# Patient Record
Sex: Male | Born: 1945 | Race: White | Hispanic: No | Marital: Married | Smoking: Never smoker
Health system: Southern US, Community
[De-identification: ages and names within clinical notes are randomized; demographics above are authoritative.]

## PROBLEM LIST (undated history)

## (undated) DIAGNOSIS — I10 Essential (primary) hypertension: Secondary | ICD-10-CM

## (undated) DIAGNOSIS — R972 Elevated prostate specific antigen [PSA]: Secondary | ICD-10-CM

## (undated) DIAGNOSIS — E785 Hyperlipidemia, unspecified: Secondary | ICD-10-CM

## (undated) DIAGNOSIS — Z87442 Personal history of urinary calculi: Secondary | ICD-10-CM

## (undated) DIAGNOSIS — M19012 Primary osteoarthritis, left shoulder: Secondary | ICD-10-CM

## (undated) DIAGNOSIS — K219 Gastro-esophageal reflux disease without esophagitis: Secondary | ICD-10-CM

## (undated) DIAGNOSIS — E119 Type 2 diabetes mellitus without complications: Secondary | ICD-10-CM

## (undated) DIAGNOSIS — C689 Malignant neoplasm of urinary organ, unspecified: Secondary | ICD-10-CM

## (undated) DIAGNOSIS — R339 Retention of urine, unspecified: Secondary | ICD-10-CM

## (undated) DIAGNOSIS — Z87448 Personal history of other diseases of urinary system: Secondary | ICD-10-CM

## (undated) DIAGNOSIS — R351 Nocturia: Secondary | ICD-10-CM

## (undated) DIAGNOSIS — N400 Enlarged prostate without lower urinary tract symptoms: Secondary | ICD-10-CM

## (undated) DIAGNOSIS — R51 Headache: Secondary | ICD-10-CM

## (undated) DIAGNOSIS — G039 Meningitis, unspecified: Secondary | ICD-10-CM

## (undated) DIAGNOSIS — R300 Dysuria: Secondary | ICD-10-CM

## (undated) DIAGNOSIS — E875 Hyperkalemia: Secondary | ICD-10-CM

## (undated) DIAGNOSIS — R3915 Urgency of urination: Secondary | ICD-10-CM

## (undated) DIAGNOSIS — R350 Frequency of micturition: Secondary | ICD-10-CM

## (undated) DIAGNOSIS — Z8719 Personal history of other diseases of the digestive system: Secondary | ICD-10-CM

## (undated) HISTORY — PX: OTHER SURGICAL HISTORY: SHX169

## (undated) HISTORY — PX: URETEROLITHOTOMY: SHX71

## (undated) HISTORY — DX: Elevated prostate specific antigen (PSA): R97.20

## (undated) HISTORY — DX: Essential (primary) hypertension: I10

## (undated) HISTORY — DX: Hyperlipidemia, unspecified: E78.5

## (undated) HISTORY — DX: Hyperkalemia: E87.5

---
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
## 1995-08-14 HISTORY — PX: ROTATOR CUFF REPAIR: SHX139

## 2003-04-02 ENCOUNTER — Ambulatory Visit (HOSPITAL_COMMUNITY): Admission: RE | Admit: 2003-04-02 | Discharge: 2003-04-02 | Payer: Self-pay | Admitting: Gastroenterology

## 2003-10-08 ENCOUNTER — Ambulatory Visit (HOSPITAL_COMMUNITY): Admission: RE | Admit: 2003-10-08 | Discharge: 2003-10-08 | Payer: Self-pay | Admitting: *Deleted

## 2004-01-21 ENCOUNTER — Ambulatory Visit (HOSPITAL_COMMUNITY): Admission: RE | Admit: 2004-01-21 | Discharge: 2004-01-21 | Payer: Self-pay | Admitting: Unknown Physician Specialty

## 2004-03-09 ENCOUNTER — Ambulatory Visit (HOSPITAL_COMMUNITY): Admission: RE | Admit: 2004-03-09 | Discharge: 2004-03-10 | Payer: Self-pay | Admitting: Neurological Surgery

## 2004-03-09 HISTORY — PX: ANTERIOR CERVICAL DECOMP/DISCECTOMY FUSION: SHX1161

## 2004-03-30 ENCOUNTER — Encounter: Admission: RE | Admit: 2004-03-30 | Discharge: 2004-03-30 | Payer: Self-pay | Admitting: Neurological Surgery

## 2004-04-24 ENCOUNTER — Encounter: Admission: RE | Admit: 2004-04-24 | Discharge: 2004-04-24 | Payer: Self-pay | Admitting: Neurological Surgery

## 2004-06-13 ENCOUNTER — Encounter: Admission: RE | Admit: 2004-06-13 | Discharge: 2004-06-13 | Payer: Self-pay | Admitting: Neurological Surgery

## 2004-08-10 ENCOUNTER — Encounter: Admission: RE | Admit: 2004-08-10 | Discharge: 2004-08-10 | Payer: Self-pay | Admitting: Neurological Surgery

## 2004-08-13 HISTORY — PX: INGUINAL HERNIA REPAIR: SUR1180

## 2004-10-06 ENCOUNTER — Ambulatory Visit: Payer: Self-pay | Admitting: Family Medicine

## 2004-10-20 ENCOUNTER — Ambulatory Visit: Payer: Self-pay | Admitting: Family Medicine

## 2005-01-22 ENCOUNTER — Ambulatory Visit: Payer: Self-pay | Admitting: Family Medicine

## 2005-04-24 ENCOUNTER — Ambulatory Visit: Payer: Self-pay | Admitting: Family Medicine

## 2005-06-12 ENCOUNTER — Ambulatory Visit: Payer: Self-pay | Admitting: Family Medicine

## 2005-07-24 ENCOUNTER — Ambulatory Visit: Payer: Self-pay | Admitting: Family Medicine

## 2006-01-24 ENCOUNTER — Ambulatory Visit: Payer: Self-pay | Admitting: Family Medicine

## 2006-03-14 ENCOUNTER — Ambulatory Visit: Payer: Self-pay | Admitting: Family Medicine

## 2006-09-10 ENCOUNTER — Ambulatory Visit: Payer: Self-pay | Admitting: Family Medicine

## 2006-09-27 ENCOUNTER — Ambulatory Visit: Payer: Self-pay | Admitting: Family Medicine

## 2006-10-16 ENCOUNTER — Ambulatory Visit: Payer: Self-pay | Admitting: Family Medicine

## 2009-04-12 DIAGNOSIS — I10 Essential (primary) hypertension: Secondary | ICD-10-CM | POA: Insufficient documentation

## 2009-04-12 DIAGNOSIS — E785 Hyperlipidemia, unspecified: Secondary | ICD-10-CM

## 2009-04-12 DIAGNOSIS — R972 Elevated prostate specific antigen [PSA]: Secondary | ICD-10-CM

## 2009-04-12 DIAGNOSIS — R42 Dizziness and giddiness: Secondary | ICD-10-CM | POA: Insufficient documentation

## 2009-04-13 ENCOUNTER — Ambulatory Visit: Payer: Self-pay | Admitting: Cardiology

## 2009-04-21 ENCOUNTER — Telehealth: Payer: Self-pay | Admitting: Cardiology

## 2009-04-22 ENCOUNTER — Ambulatory Visit: Payer: Self-pay | Admitting: Cardiology

## 2009-04-22 ENCOUNTER — Encounter: Payer: Self-pay | Admitting: Cardiology

## 2009-05-10 ENCOUNTER — Encounter: Payer: Self-pay | Admitting: Cardiology

## 2009-05-11 ENCOUNTER — Ambulatory Visit: Payer: Self-pay | Admitting: Cardiology

## 2009-05-11 DIAGNOSIS — R5381 Other malaise: Secondary | ICD-10-CM

## 2009-05-11 DIAGNOSIS — R5383 Other fatigue: Secondary | ICD-10-CM

## 2009-05-11 DIAGNOSIS — R079 Chest pain, unspecified: Secondary | ICD-10-CM | POA: Insufficient documentation

## 2010-12-26 NOTE — Assessment & Plan Note (Signed)
[facility]                            CARDIOLOGY OFFICE NOTE   Frank Delacruz, Frank Delacruz                       MRN:          [id number]  DATE:04/13/2009                            DOB:          05-28-1946    PRIMARY PHYSICIAN:  Delaney Meigs, MD   REASON FOR PRESENTATION:  Evaluate the patient with chest pain.   HISTORY OF PRESENT ILLNESS:  The patient is a pleasant 65 year old  gentleman.  He was seen at [facility] in 2005 for chest pain.  He had a  stress perfusion study at that time that demonstrated no evidence of  ischemia or infarct.  He has not had any cardiac evaluation since that  time.  However, he did present to [facility] on August 16 with chest  discomfort.  I do not have any of these records.  He apparently was kept  overnight.  It was suggested that he have a stress test, but he wanted  to leave.  He describes some chest discomfort that was right of his  sternum.  It felt like pins and needles, but he said it was severe.  It  woken from his sleep.  Waxed and waned apparently.  There was some  nausea associated.  There was no radiation to neck into his arms.  He  did not have this before.  He was not short of breath.  He was  apparently clammy and somewhat diaphoretic.  He said that since that  time, he has had no recurrence of this discomfort.  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  With his activities of daily living, he does not bring on any of these  symptoms.  He denies any resting complaints such as PND or orthopnea.  He is not reporting palpitations, presyncope, or syncope.  Of note, the  patient did have a slow heart rhythm apparently when he was in the  hospital.  He recently had his dose of beta-blocker reduced by Dr.  Lysbeth Galas.   PAST MEDICAL HISTORY:  Hypertension x6 years, hypertriglyceridemia,  benign prostatic hypertrophy.   PAST SURGICAL HISTORY:  Rotator cuff  surgery, hernia repair of right  inguinal, neck fusion.   ALLERGIES:  Intolerances to NAPROXEN, DICLOFENAC, MELOXICAM.   MEDICATIONS:  1. Avodart 0.5 mg daily.  2. Atenolol 50 mg daily.  3. Hydrocodone/APAP.  4. Aspirin 81 mg daily.  5. Fish oil daily.  This couple about the   SOCIAL HISTORY:  The patient is retired.  He is married.  He has no  children.  He does not smoke cigarettes and never has though he does  chew tobacco occasionally when he is bored.   FAMILY HISTORY:  Contributory for brother dying at age 59 apparently of  heart disease.  His sister died recently suddenly of a myocardial  infarction at 71, though she was apparently ill prior to this.   REVIEW OF SYSTEMS:  As stated in the HPI and positive for reflux,  urinary frequency, joint pains.  Negative for all other systems.  PHYSICAL EXAMINATION:  GENERAL:  The patient is pleasant and in no  distress.  VITAL SIGNS:  Blood pressure 130/86, heart rate 56 and regular, weight  191 pounds, body mass index 30.  HEENT:  Eyelids unremarkable, pupils equal, round, and reactive to  light, fundi not visualized, oral mucosa unremarkable.  NECK:  No jugular venous distention at 45 degrees.  Carotid upstroke  brisk and symmetrical.  No bruits, no thyromegaly.  LYMPHATICS:  No  cervical, axillary, or inguinal adenopathy.  LUNGS:  Clear to auscultation bilaterally.  BACK:  No costovertebral angle tenderness.  CHEST:  Unremarkable.  HEART:  PMI not displaced or sustained, S1 and S2 within normal limits,  no S3, no S4, no clicks, no rubs, no murmurs.  ABDOMEN:  Flat, positive  bowel sounds, normal in frequency and pitch.  No bruits, no rebound, no  guarding, no midline pulsatile mass, no hepatomegaly, no splenomegaly.  SKIN:  No rashes, no nodules.  EXTREMITIES:  2+ pulses throughout, no edema, no cyanosis, or clubbing.  NEURO:  Oriented to birth, place, and time.  Cranial nerves II through  XII grossly intact, motor grossly  intact.   EKG sinus rhythm, rate 56, axis within normal limits, intervals within  normal limits, early transition lead V2, no acute ST-T wave changes.   ASSESSMENT AND PLAN:  1. Chest:  The patient's chest comfort has some typical and some      atypical features.  He does have a family history.  I think the      pretest probability of obstructive coronary disease is low.      However, given the family history in particular and his history of      hypertension, I think screening with an exercise treadmill test is      indicated.  This would be a plain old exercise treadmill (POET).      He wants to have this done in [place], and I will arrange this.  2. Tobacco.  He understands need to stop all tobacco products.  3. Hypertension.  His blood pressure is controlled.  I would agree      with decreasing the beta-blocker given his recent bradycardia.  4. Risk reduction.  He reports his lipids have been followed by Dr.      Lysbeth Galas, and that he has had a high triglyceride, but no other      significant abnormalities.  I would suggest that given the family      history of goal LDL less than 100, HDL greater than 40, it would be      aggressive, but reasonable.  5. Followup.  I will see him back as needed based on the results of      the pulse oximeter/end tidal.    Rollene Rotunda, MD, [facility]  Electronically Signed   JH/MedQ  DD: 04/13/2009  DT: 04/13/2009  Job #: [id number]   cc:   Delaney Meigs, M.D.

## 2010-12-29 NOTE — Op Note (Signed)
NAME:  Frank Delacruz, Frank Delacruz                          ACCOUNT NO.:  [id number]   MEDICAL RECORD NO.:  [id number]                   PATIENT TYPE:  OIB   LOCATION:  2899                                 FACILITY:  [facility]   PHYSICIAN:  Tia Alert, MD                  DATE OF BIRTH:  September 02, 1945   DATE OF PROCEDURE:  03/09/2004  DATE OF DISCHARGE:                                 OPERATIVE REPORT   PREOPERATIVE DIAGNOSIS:  Cervical spondylosis with degenerative disk  disease, C5-6, with left C6 radiculopathy.   POSTOPERATIVE DIAGNOSIS:  Cervical spondylosis with degenerative disk  disease, C5-6, with left C6 radiculopathy.   PROCEDURES:  1. Decompressive anterior cervical diskectomy, C5-6, for nerve root     decompression.  2. Anterior cervical arthrodesis, C5-6, utilizing an 8 mm Tutogen allograft.  3. Anterior cervical plating, C5-6, with a 25 mm Zephir plate.   SURGEON:  Tia Alert, M.D.   ASSISTANT:  Reinaldo Meeker, M.D.   ANESTHESIA:  General endotracheal.   COMPLICATIONS:  None apparent.   INDICATION FOR PROCEDURE:  Mr. Wilkerson is a 65 year old white male who was  referred to the neurosurgery clinic with complaints of neck pain with left  arm pain in a C6 distribution.  He had an MRI and plain films, which showed  degenerative disk disease with spondylosis at C5-6 with neural foraminal  narrowing at C5-6 on the left side.  I recommended anterior cervical  diskectomy, fusion, and plating at C5-6.  We discussed the risks, benefits  and expected outcome.  He wished to proceed.   DESCRIPTION OF PROCEDURE:  The patient was taken to the operating room and  after induction of adequate generalized endotracheal anesthesia, he was  placed in the supine position on the operating room table.  His right  anterior cervical region was prepped with Duraprep and then draped in the  usual sterile fashion.  Local anesthesia 5 mL was injected and an incision  was made to the right of midline  and then carried down to the platysma  muscle, which was elevated, opened, and undermined with Metzenbaum scissors.  I then dissected in a plane medial to the sternocleidomastoid muscle,  internal carotid artery, and lateral to the trachea and esophagus to expose  the anterior cervical spine at C5-6.  Intraoperative fluoroscopy confirmed  our level at C5-6, and then the anterior osteophytes were removed with a  Leksell rongeur.  The longus colli muscles were taken down bilaterally and  the Shadow Line retractors were placed under these to expose the anterior  cervical spine at C5-6.  The annulus was incised.  The anterior osteophytes  were removed with a Kerrison punch and then the initial diskectomy was done  with pituitary rongeurs and the end plates were drilled with the high-speed  Black Max air-powered drill to prepare the end plates for arthrodesis.  We  drilled down  to the level of the posterior longitudinal ligament and brought  in the operating microscope.  This was then opened and it was removed in a  circumferential fashion to expose the underlying dura.  The nerve roots were  identified bilaterally and the C6 nerve roots were followed out into their  foramina.  He had a small diverticulum on the left C6 nerve root, probably  of clinical insignificance, but both nerve roots were followed out to the  foramen.  They were completely decompressed.  We then irrigated with saline  solution and dried the surgical bed with Surgifoam.  We then measured the  disk space to be 8 mm and tapped the 8 mm Tutogen allograft into the  interspace.  We checked this under fluoroscopy.  We then used the 25 mm  Zephir plate and placed two 13 mm screws in the body of C5, two in the body  of C6, and locked it into position with a sliding mechanism and then once  again checked this under fluoroscopy.  The retractor system was removed and  the bleeding points were identified and coagulated with bipolar  cautery.  Once meticulous hemostasis was achieved, we closed the platysma and then the  subcuticular tissues with 3-0 Vicryl.  We then closed the skin with  Dermabond.  The drapes were removed.  A sterile dressing was applied. The  patient was awakened from general anesthesia and transferred to the recovery  room in stable condition.  At the end of the procedure all sponge, needle,  and instrument counts were correct.                                               Tia Alert, MD    DSJ/MEDQ  D:  03/09/2004  T:  03/09/2004  Job:  [id number]

## 2011-01-25 DIAGNOSIS — R072 Precordial pain: Secondary | ICD-10-CM

## 2011-03-20 ENCOUNTER — Encounter: Payer: Self-pay | Admitting: Cardiology

## 2011-10-23 ENCOUNTER — Ambulatory Visit (INDEPENDENT_AMBULATORY_CARE_PROVIDER_SITE_OTHER): Payer: Medicare Other | Admitting: Urology

## 2011-10-23 DIAGNOSIS — N400 Enlarged prostate without lower urinary tract symptoms: Secondary | ICD-10-CM

## 2011-10-23 DIAGNOSIS — C679 Malignant neoplasm of bladder, unspecified: Secondary | ICD-10-CM

## 2011-10-30 ENCOUNTER — Other Ambulatory Visit: Payer: Self-pay | Admitting: Urology

## 2011-11-13 ENCOUNTER — Encounter (HOSPITAL_BASED_OUTPATIENT_CLINIC_OR_DEPARTMENT_OTHER): Payer: Self-pay | Admitting: *Deleted

## 2011-11-16 ENCOUNTER — Encounter (HOSPITAL_BASED_OUTPATIENT_CLINIC_OR_DEPARTMENT_OTHER): Payer: Self-pay | Admitting: *Deleted

## 2011-11-16 NOTE — Progress Notes (Signed)
NPO AFTER MN. ARRIVES AT 0715. NEEDS ISTAT AND EKG. WILL TAKE ATENOLOL AND ZANTAC AM OF SURG. W/ SIP OF WATER.

## 2011-11-21 ENCOUNTER — Encounter (HOSPITAL_BASED_OUTPATIENT_CLINIC_OR_DEPARTMENT_OTHER): Payer: Self-pay | Admitting: Anesthesiology

## 2011-11-21 ENCOUNTER — Encounter (HOSPITAL_BASED_OUTPATIENT_CLINIC_OR_DEPARTMENT_OTHER): Payer: Self-pay | Admitting: *Deleted

## 2011-11-21 ENCOUNTER — Encounter (HOSPITAL_BASED_OUTPATIENT_CLINIC_OR_DEPARTMENT_OTHER)
Admission: RE | Disposition: A | Discharge status: Home / Self Care | Payer: Self-pay | Source: Ambulatory Visit | Attending: Urology

## 2011-11-21 ENCOUNTER — Ambulatory Visit (HOSPITAL_BASED_OUTPATIENT_CLINIC_OR_DEPARTMENT_OTHER)
Admission: RE | Admit: 2011-11-21 | Discharge: 2011-11-21 | Disposition: A | Discharge status: Home / Self Care | Payer: Medicare Other | Source: Ambulatory Visit | Attending: Urology | Admitting: Urology

## 2011-11-21 ENCOUNTER — Ambulatory Visit (HOSPITAL_BASED_OUTPATIENT_CLINIC_OR_DEPARTMENT_OTHER): Payer: Medicare Other | Admitting: Anesthesiology

## 2011-11-21 DIAGNOSIS — N3090 Cystitis, unspecified without hematuria: Secondary | ICD-10-CM | POA: Insufficient documentation

## 2011-11-21 DIAGNOSIS — K219 Gastro-esophageal reflux disease without esophagitis: Secondary | ICD-10-CM | POA: Insufficient documentation

## 2011-11-21 DIAGNOSIS — C669 Malignant neoplasm of unspecified ureter: Secondary | ICD-10-CM | POA: Insufficient documentation

## 2011-11-21 DIAGNOSIS — I10 Essential (primary) hypertension: Secondary | ICD-10-CM | POA: Insufficient documentation

## 2011-11-21 DIAGNOSIS — N35919 Unspecified urethral stricture, male, unspecified site: Secondary | ICD-10-CM | POA: Insufficient documentation

## 2011-11-21 DIAGNOSIS — E785 Hyperlipidemia, unspecified: Secondary | ICD-10-CM | POA: Insufficient documentation

## 2011-11-21 DIAGNOSIS — Z8551 Personal history of malignant neoplasm of bladder: Secondary | ICD-10-CM | POA: Insufficient documentation

## 2011-11-21 DIAGNOSIS — N329 Bladder disorder, unspecified: Secondary | ICD-10-CM | POA: Insufficient documentation

## 2011-11-21 DIAGNOSIS — D494 Neoplasm of unspecified behavior of bladder: Secondary | ICD-10-CM | POA: Insufficient documentation

## 2011-11-21 HISTORY — DX: Personal history of urinary calculi: Z87.442

## 2011-11-21 HISTORY — DX: Nocturia: R35.1

## 2011-11-21 HISTORY — DX: Malignant neoplasm of urinary organ, unspecified: C68.9

## 2011-11-21 HISTORY — DX: Personal history of other diseases of the digestive system: Z87.19

## 2011-11-21 HISTORY — DX: Urgency of urination: R39.15

## 2011-11-21 HISTORY — PX: CYSTOSCOPY W/ RETROGRADES: SHX1426

## 2011-11-21 HISTORY — DX: Gastro-esophageal reflux disease without esophagitis: K21.9

## 2011-11-21 HISTORY — DX: Primary osteoarthritis, left shoulder: M19.012

## 2011-11-21 HISTORY — DX: Frequency of micturition: R35.0

## 2011-11-21 SURGERY — CYSTOSCOPY, WITH RETROGRADE PYELOGRAM
Anesthesia: General | Site: Bladder | Laterality: Bilateral | Wound class: Clean Contaminated

## 2011-11-21 MED ORDER — LACTATED RINGERS IV SOLN: INTRAVENOUS | Status: DC

## 2011-11-21 MED ORDER — LIDOCAINE HCL (CARDIAC) 20 MG/ML IV SOLN
INTRAVENOUS | Status: DC | PRN
  Administered 2011-11-21: 80 mg via INTRAVENOUS

## 2011-11-21 MED ORDER — SULFAMETHOXAZOLE-TRIMETHOPRIM 800-160 MG PO TABS: 1.0000 | ORAL_TABLET | Freq: Two times a day (BID) | ORAL | Status: AC

## 2011-11-21 MED ORDER — GLYCOPYRROLATE 0.2 MG/ML IJ SOLN
INTRAMUSCULAR | Status: DC | PRN
  Administered 2011-11-21: 0.4 mg via INTRAVENOUS

## 2011-11-21 MED ORDER — OXYCODONE HCL 5 MG PO TABS: 5.0000 mg | ORAL_TABLET | ORAL | Status: DC | PRN

## 2011-11-21 MED ORDER — FENTANYL CITRATE 0.05 MG/ML IJ SOLN
25.0000 ug | INTRAMUSCULAR | Status: DC | PRN
  Administered 2011-11-21 (×2): 25 ug via INTRAVENOUS

## 2011-11-21 MED ORDER — ONDANSETRON HCL 4 MG/2ML IJ SOLN
INTRAMUSCULAR | Status: DC | PRN
  Administered 2011-11-21: 4 mg via INTRAVENOUS

## 2011-11-21 MED ORDER — IOHEXOL 350 MG/ML SOLN
INTRAVENOUS | Status: DC | PRN
  Administered 2011-11-21: 10 mL via INTRAVENOUS

## 2011-11-21 MED ORDER — OXYCODONE-ACETAMINOPHEN 5-325 MG PO TABS
1.0000 | ORAL_TABLET | ORAL | Status: DC | PRN
  Administered 2011-11-21: 1 via ORAL

## 2011-11-21 MED ORDER — SODIUM CHLORIDE 0.9 % IJ SOLN: 3.0000 mL | INTRAMUSCULAR | Status: DC | PRN

## 2011-11-21 MED ORDER — SODIUM CHLORIDE 0.9 % IV SOLN: 250.0000 mL | INTRAVENOUS | Status: DC | PRN

## 2011-11-21 MED ORDER — MORPHINE SULFATE 2 MG/ML IJ SOLN: 2.0000 mg | INTRAMUSCULAR | Status: DC | PRN

## 2011-11-21 MED ORDER — CIPROFLOXACIN IN D5W 400 MG/200ML IV SOLN
400.0000 mg | INTRAVENOUS | Status: AC
  Administered 2011-11-21: 400 mg via INTRAVENOUS

## 2011-11-21 MED ORDER — URIBEL 118 MG PO CAPS: 1.0000 | ORAL_CAPSULE | Freq: Three times a day (TID) | ORAL | Status: DC | PRN

## 2011-11-21 MED ORDER — ACETAMINOPHEN 650 MG RE SUPP: 650.0000 mg | RECTAL | Status: DC | PRN

## 2011-11-21 MED ORDER — URELLE 81 MG PO TABS
1.0000 | ORAL_TABLET | Freq: Four times a day (QID) | ORAL | Status: DC
  Administered 2011-11-21: 81 mg via ORAL

## 2011-11-21 MED ORDER — SODIUM CHLORIDE 0.9 % IJ SOLN: 3.0000 mL | Freq: Two times a day (BID) | INTRAMUSCULAR | Status: DC

## 2011-11-21 MED ORDER — PROPOFOL 10 MG/ML IV EMUL
INTRAVENOUS | Status: DC | PRN
  Administered 2011-11-21: 200 mg via INTRAVENOUS
  Administered 2011-11-21: 20 mg via INTRAVENOUS

## 2011-11-21 MED ORDER — LACTATED RINGERS IV SOLN
INTRAVENOUS | Status: DC
  Administered 2011-11-21 (×3): via INTRAVENOUS

## 2011-11-21 MED ORDER — ACETAMINOPHEN 325 MG PO TABS: 650.0000 mg | ORAL_TABLET | ORAL | Status: DC | PRN

## 2011-11-21 MED ORDER — PROMETHAZINE HCL 25 MG/ML IJ SOLN
6.2500 mg | INTRAMUSCULAR | Status: DC | PRN
  Administered 2011-11-21: 6.25 mg via INTRAVENOUS

## 2011-11-21 MED ORDER — FENTANYL CITRATE 0.05 MG/ML IJ SOLN
INTRAMUSCULAR | Status: DC | PRN
  Administered 2011-11-21: 25 ug via INTRAVENOUS
  Administered 2011-11-21: 50 ug via INTRAVENOUS
  Administered 2011-11-21: 25 ug via INTRAVENOUS

## 2011-11-21 MED ORDER — ONDANSETRON HCL 4 MG/2ML IJ SOLN: 4.0000 mg | Freq: Four times a day (QID) | INTRAMUSCULAR | Status: DC | PRN

## 2011-11-21 MED ORDER — STERILE WATER FOR IRRIGATION IR SOLN
Status: DC | PRN
  Administered 2011-11-21: 3000 mL

## 2011-11-21 MED ORDER — EPHEDRINE SULFATE 50 MG/ML IJ SOLN
INTRAMUSCULAR | Status: DC | PRN
  Administered 2011-11-21 (×2): 15 mg via INTRAVENOUS

## 2011-11-21 MED ORDER — OXYCODONE-ACETAMINOPHEN 5-325 MG PO TABS: 1.0000 | ORAL_TABLET | ORAL | Status: AC | PRN

## 2011-11-21 SURGICAL SUPPLY — 23 items
ADAPTER CATH URET PLST 4-6FR (CATHETERS) ×1 IMPLANT
ADPR CATH URET STRL DISP 4-6FR (CATHETERS) ×1
BAG DRAIN URO-CYSTO SKYTR STRL (DRAIN) ×2 IMPLANT
BAG DRN UROCATH (DRAIN) ×1
BASKET ZERO TIP NITINOL 2.4FR (BASKET) ×1 IMPLANT
BSKT STON RTRVL ZERO TP 2.4FR (BASKET) ×1
CANISTER SUCT LVC 12 LTR MEDI- (MISCELLANEOUS) ×2 IMPLANT
CATH INTERMIT  6FR 70CM (CATHETERS) ×1 IMPLANT
CATH URET 5FR 28IN CONE TIP (BALLOONS)
CATH URET 5FR 28IN OPEN ENDED (CATHETERS) IMPLANT
CATH URET 5FR 70CM CONE TIP (BALLOONS) IMPLANT
CLOTH BEACON ORANGE TIMEOUT ST (SAFETY) ×2 IMPLANT
DRAPE CAMERA CLOSED 9X96 (DRAPES) ×2 IMPLANT
GLOVE BIO SURGEON STRL SZ8 (GLOVE) ×2 IMPLANT
GLOVE INDICATOR 6.5 STRL GRN (GLOVE) ×2 IMPLANT
GOWN PREVENTION PLUS LG XLONG (DISPOSABLE) ×2 IMPLANT
GOWN STRL REIN XL XLG (GOWN DISPOSABLE) ×2 IMPLANT
GUIDEWIRE 0.038 PTFE COATED (WIRE) IMPLANT
GUIDEWIRE ANG ZIPWIRE 038X150 (WIRE) IMPLANT
GUIDEWIRE STR DUAL SENSOR (WIRE) ×1 IMPLANT
NS IRRIG 500ML POUR BTL (IV SOLUTION) IMPLANT
PACK CYSTOSCOPY (CUSTOM PROCEDURE TRAY) ×2 IMPLANT
WATER STERILE IRR 3000ML UROMA (IV SOLUTION) ×1 IMPLANT

## 2011-11-21 NOTE — Discharge Instructions (Addendum)

## 2011-11-21 NOTE — Anesthesia Postprocedure Evaluation (Signed)
Anesthesia Post Note  Patient: Frank Delacruz  Procedure(s) Performed: Procedure(s) (LRB): CYSTOSCOPY WITH RETROGRADE PYELOGRAM (Bilateral)  Anesthesia type: General  Patient location: PACU  Post pain: Pain level controlled  Post assessment: Post-op Vital signs reviewed  Last Vitals:  Filed Vitals:   11/21/11 1130  BP: 164/99  Pulse: 76  Temp: 36.1 C  Resp: 16    Post vital signs: Reviewed  Level of consciousness: sedated  Complications: No apparent anesthesia complications

## 2011-11-21 NOTE — H&P (Signed)
Urology History and Physical Exam  CC: History of bladder cancer  HPI: 66 year old male with the following history:    His history started in January, 2012. At that time, he presented with a right-sided distal ureteral stone which needed ureteroscopy and extraction. At the time of that anesthetic procedure, he was found to have a left-sided bladder tumor. This was resected by Dr. Baldo Ash in [place], [place]. Pathology revealed high-grade papillary urothelial carcinoma without evidence of lamina propria or muscular invasion. Because of the high-grade nature of his disease, he underwent induction BCG therapy. Following this, he underwent repeat anesthetic cystoscopy and TURBT of a recurrent tumor on 02/08/2011. He underwent a second induction BCG series, which was completed by September, 2012. Followup cystoscopy in September and December 2012 were clear.  Dr. Baldo Ash left the region, and the patient at this point came back for further surveillance. He has some dysuria, and significant lower urinary tract symptoms including frequency, urgency, occasional dysuria, nocturia, hesitancy and intermittency. However, he has not had recurrent gross hematuria.  Surveillance cystoscopy at our office in [place] on 10/23/2011 revealed erythema at the old bladder tumor site, and cytologies were positive. He presents at this time for cystoscopy, bladder biopsy and fulguration and retrograde pyelograms  He is on both finasteride and tamsulosin for his BPH symptoms.    PMH: Past Medical History  Diagnosis Date  . Hyperlipidemia, mild   . Increased prostate specific antigen (PSA) velocity   . Hypertension   . Urothelial carcinoma of bladder  . H/O hiatal hernia   . GERD (gastroesophageal reflux disease) occasionlly takes zantac  . Arthritis of shoulder region, left   . History of kidney stones   . Frequency of urination   . Urgency of urination   . Nocturia   . Hematuria     PSH: Past Surgical  History  Procedure Date  . Anterior cervical decomp/discectomy fusion 03-09-2004    C5 - 6  . Echo/ cardiology stress test 04-22-2009  [facility]    NORMAL  . Ureterolithotomy MARCH 2012-- [facility]  . Cysto/  resection bladder lesion with bx JULY 2012-- [facility]  . Rotator cuff repair 1997    LEFT  . Inguinal hernia repair 2006    RIGHT    Allergies: Allergies  Allergen Reactions  . Meloxicam Nausea And Vomiting  . Naproxen Nausea And Vomiting    Medications: No prescriptions prior to admission     Social History: History   Social History  . Marital Status: Married    Spouse Name: N/A    Number of Children: N/A  . Years of Education: N/A   Occupational History  . Not on file.   Social History Main Topics  . Smoking status: Former Smoker    Types: Cigarettes  . Smokeless tobacco: Current User    Types: Chew   Comment: CHEWED TOBACCO FOR APPROX. 50 YRS - STATES HAS CUT-DOWN SINCE MARCH 2012  . Alcohol Use: No  . Drug Use: No  . Sexually Active: Not on file   Other Topics Concern  . Not on file   Social History Narrative  . No narrative on file    Family History: Family History  Problem Relation Age of Onset  . Stroke Mother   . Cancer Father     Review of Systems: Genitourinary, constitutional, skin, eye, otolaryngeal, hematologic/lymphatic, cardiovascular, pulmonary, endocrine, musculoskeletal, gastrointestinal, neurological and psychiatric system(s) were reviewed and pertinent findings if present are noted.  Genitourinary: urinary frequency,  feelings of urinary urgency, dysuria, nocturia, incontinence, weak urinary stream, urinary stream starts and stops, erectile dysfunction and initiating urination requires straining.  Gastrointestinal: abdominal pain and constipation.  Constitutional: feeling tired (fatigue).  Musculoskeletal: back pain and joint pain.  Neurological: headache.     Physical Exam: @VITALS2 @ Constitutional: Well nourished and well  developed . No acute distress.  ENT:. The ears and nose are normal in appearance.  Neck: The appearance of the neck is normal and no neck mass is present.  Pulmonary: No respiratory distress and normal respiratory rhythm and effort.  Cardiovascular: Heart rate and rhythm are normal . No peripheral edema.  Abdomen: The abdomen is obese. The abdomen is soft and nontender. No masses are palpated. No CVA tenderness. No hernias are palpable. No hepatosplenomegaly noted.  Lymphatics: The anterior cervical, supraclavicular and axillary nodes are not enlarged or tender.  Skin: Normal skin turgor, no visible rash and no visible skin lesions.  Neuro/Psych:. Mood and affect are appropriate.       Studies:  No results found for this basename: HGB:2,WBC:2,PLT:2 in the last 72 hours  No results found for this basename: NA:2,K:2,CL:2,CO2:2,BUN:2,CREATININE:2,CALCIUM:2,MAGNESIUM:2,GFRNONAA:2,GFRAA:2 in the last 72 hours   No results found for this basename: PT:2,INR:2,APTT:2 in the last 72 hours   No components found with this basename: ABG:2    Assessment:  Probable recurrent urothelial carcinoma of the bladder.  Plan: Anesthetic cystoscopy, bilateral retrograde pyelograms, bladder biopsy/possible TURBT

## 2011-11-21 NOTE — Anesthesia Procedure Notes (Signed)
Procedure Name: LMA Insertion Date/Time: 11/21/2011 8:47 AM Performed by: Fran Lowes Pre-anesthesia Checklist: Patient identified, Emergency Drugs available, Suction available and Patient being monitored Patient Re-evaluated:Patient Re-evaluated prior to inductionOxygen Delivery Method: Circle System Utilized Preoxygenation: Pre-oxygenation with 100% oxygen Intubation Type: IV induction Ventilation: Mask ventilation without difficulty LMA: LMA with gastric port inserted LMA Size: 5.0 Number of attempts: 1 Placement Confirmation: positive ETCO2 Tube secured with: Tape Dental Injury: Teeth and Oropharynx as per pre-operative assessment

## 2011-11-21 NOTE — Transfer of Care (Signed)
Immediate Anesthesia Transfer of Care Note  Patient: Frank Delacruz  Procedure(s) Performed: Procedure(s) (LRB): CYSTOSCOPY WITH RETROGRADE PYELOGRAM (Bilateral)  Patient Location: Patient transported to PACU with oxygen via face mask at 4 Liters / Min  Anesthesia Type: General  Level of Consciousness: awake and alert   Airway & Oxygen Therapy: Patient Spontanous Breathing and Patient connected to face mask oxygen  Post-op Assessment: Report given to PACU RN and Post -op Vital signs reviewed and stable  Post vital signs: Reviewed and stable  Dentition: Teeth and oropharynx remain in pre-op condition  Complications: No apparent anesthesia complications

## 2011-11-21 NOTE — Anesthesia Preprocedure Evaluation (Signed)
Anesthesia Evaluation  Patient identified by MRN, date of birth, ID band Patient awake    Reviewed: Allergy & Precautions, H&P , NPO status , Patient's Chart, lab work & pertinent test results, reviewed documented beta blocker date and time   History of Anesthesia Complications Negative for: history of anesthetic complications  Airway Mallampati: III TM Distance: >3 FB Neck ROM: Full    Dental  (+) Edentulous Upper   Pulmonary neg pulmonary ROS,  breath sounds clear to auscultation        Cardiovascular hypertension, Pt. on medications and Pt. on home beta blockers negative cardio ROS  Rhythm:Regular     Neuro/Psych negative neurological ROS  negative psych ROS   GI/Hepatic negative GI ROS, Neg liver ROS, hiatal hernia, GERD-  Medicated,  Endo/Other  negative endocrine ROSMorbid obesity  Renal/GU negative Renal ROS  negative genitourinary   Musculoskeletal negative musculoskeletal ROS (+)   Abdominal   Peds  Hematology negative hematology ROS (+)   Anesthesia Other Findings   Reproductive/Obstetrics negative OB ROS                           Anesthesia Physical Anesthesia Plan  ASA: II  Anesthesia Plan: General   Post-op Pain Management:    Induction: Intravenous  Airway Management Planned: LMA  Additional Equipment:   Intra-op Plan:   Post-operative Plan:   Informed Consent: I have reviewed the patients History and Physical, chart, labs and discussed the procedure including the risks, benefits and alternatives for the proposed anesthesia with the patient or authorized representative who has indicated his/her understanding and acceptance.   Dental advisory given  Plan Discussed with: CRNA  Anesthesia Plan Comments:         Anesthesia Quick Evaluation

## 2011-11-21 NOTE — Op Note (Signed)
Preoperative diagnosis: History of urothelial carcinoma the bladder, status post prior resection with positive cytologies  Postoperative diagnosis: History of urothelial carcinoma the bladder, status post prior resection with positive cytologies, with mild bladder irritation and obvious papillary tumor and right ureter  Principal procedure: Cystoscopy, bilateral retrograde ureteropyelograms, right ureteroscopy with biopsy of distal ureteral tumor, bladder biopsies, interpretive fluoroscopy  Surgeon: Ltanya Bayley  Anesthesia: Gen. with LMA  Drains: None  Indications: History of urothelial carcinoma with prior resection by a urologist and he didn't. He was recently seen following BCG, and found to have positive cytologies and 2 bladder lesions. He presents this time for cystoscopy, bladder biopsy and retrograde ureteropyelograms.  Description of procedure: The patient received preoperative IV antibiotics and was taken to the operating room following adequate identification, where he received general anesthesia with LMA. He was placed in the dorsolithotomy position. Genitalia and perineum were prepped and draped. Time out was performed.  A 22 French panendoscope was advanced under direct vision through his urethra. There was a mild bulbous stricture which was easily traversed with the beak of the scope. His bladder was entered and inspected circumferentially. There were no discrete bladder tumors, but 2 areas of patchy erythema were noted, one area in the midline just superior to the trigone approximately 8 mm in size. There was another area lateral and superior to the left ureteral orifice which was approximately 1 cm in size. There was an obvious scar from prior resection at the level of the ureteral orifice on the left, with a golf-hole orifice noted. Bilateral retrograde ureteropyelograms were performed as follows:  A 6 French open-end catheter was advanced into the right ureteral orifice, with  retrograde revealing a 2-3 cm area of filling defect in the distal ureter, approximately 2-3 cm from the UVJ. There was no significant hydronephrosis or filling defects above this. The pyelocalyceal system was normal on the right. On the left, the entire ureter was normal, with a normal pyelocalyceal system. No filling defects were seen on either side.  A guidewire was placed into the right ureter, and ureteroscopy was performed with the rigid scope. This revealed a 3-3-1/2 cm length of ureter involved with papillary lesion. The scope was advanced past this and no significant lesions were seen. I used the stone basket to try to get biopsies from this. This was unsuccessful, and eventually is a small ureteral biopsy forceps which gave me some small samples that we sent to pathology labeled ureteral tumor. There was minimal bleeding. At this point, attention was then turned to the bladder, the 2 prior erythematous areas were biopsied, and sent as posterior bladder biopsy and left bladder biopsy. Both of these areas were cauterized. There was adequate hemostasis, and after the entire bladder was evaluated again and found to be normal, except for 2-3+ trabeculations, the scope was removed after the bladder was drained. A B&O suppository was placed. The patient was awakened and taken to PACU in stable condition.

## 2011-11-22 ENCOUNTER — Encounter (HOSPITAL_BASED_OUTPATIENT_CLINIC_OR_DEPARTMENT_OTHER): Payer: Self-pay | Admitting: Urology

## 2011-11-27 ENCOUNTER — Other Ambulatory Visit: Payer: Self-pay | Admitting: Urology

## 2011-11-27 DIAGNOSIS — D4100 Neoplasm of uncertain behavior of unspecified kidney: Secondary | ICD-10-CM

## 2011-11-30 ENCOUNTER — Ambulatory Visit (HOSPITAL_COMMUNITY)
Admission: RE | Admit: 2011-11-30 | Discharge: 2011-11-30 | Disposition: A | Discharge status: Home / Self Care | Payer: Medicare Other | Source: Ambulatory Visit | Attending: Urology | Admitting: Urology

## 2011-11-30 DIAGNOSIS — K7689 Other specified diseases of liver: Secondary | ICD-10-CM | POA: Insufficient documentation

## 2011-11-30 DIAGNOSIS — D4100 Neoplasm of uncertain behavior of unspecified kidney: Secondary | ICD-10-CM | POA: Insufficient documentation

## 2011-11-30 DIAGNOSIS — R9389 Abnormal findings on diagnostic imaging of other specified body structures: Secondary | ICD-10-CM | POA: Insufficient documentation

## 2011-11-30 MED ORDER — IOHEXOL 300 MG/ML  SOLN
125.0000 mL | Freq: Once | INTRAMUSCULAR | Status: AC | PRN
  Administered 2011-11-30: 125 mL via INTRAVENOUS

## 2011-12-25 ENCOUNTER — Ambulatory Visit (INDEPENDENT_AMBULATORY_CARE_PROVIDER_SITE_OTHER): Payer: Medicare Other | Admitting: Urology

## 2011-12-25 DIAGNOSIS — D4100 Neoplasm of uncertain behavior of unspecified kidney: Secondary | ICD-10-CM

## 2011-12-27 ENCOUNTER — Other Ambulatory Visit: Payer: Self-pay | Admitting: Urology

## 2012-01-04 ENCOUNTER — Encounter (HOSPITAL_COMMUNITY): Payer: Self-pay | Admitting: Pharmacy Technician

## 2012-01-09 ENCOUNTER — Encounter (HOSPITAL_COMMUNITY)
Admission: RE | Admit: 2012-01-09 | Discharge: 2012-01-09 | Disposition: A | Discharge status: Home / Self Care | Payer: Medicare Other | Source: Ambulatory Visit | Attending: Urology | Admitting: Urology

## 2012-01-09 ENCOUNTER — Encounter (HOSPITAL_COMMUNITY): Payer: Self-pay

## 2012-01-09 LAB — BASIC METABOLIC PANEL
BUN: 17 mg/dL (ref 6–23)
CO2: 27 mEq/L (ref 19–32)
GFR calc non Af Amer: >90 mL/min (ref >90)
Glucose, Bld: 162 mg/dL — ABNORMAL HIGH (ref 70–99)
Potassium: 4.4 mEq/L (ref 3.5–5.1)

## 2012-01-09 LAB — CBC
HCT: 46.7 % (ref 39.0–52.0)
Hemoglobin: 15.7 g/dL (ref 13.0–17.0)
MCH: 29.6 pg (ref 26.0–34.0)
MCHC: 33.6 g/dL (ref 30.0–36.0)
MCV: 88.1 fL (ref 78.0–100.0)

## 2012-01-09 NOTE — Pre-Procedure Instructions (Addendum)
01-09-12 EKG 11-23-11/ CXR requested from [facility],(01-24-11 CXR report received and with chart.

## 2012-01-09 NOTE — Patient Instructions (Signed)
20 KELVIS BERGER  01/09/2012   Your procedure is scheduled on:   6-3--2013  Report to [facility] at  0515      AM.  Call this number if you have problems the morning of surgery: [phone]   Remember:   Do not eat food:After Midnight.    Take these medicines the morning of surgery with A SIP OF WATER: Ternormin, Finasteride   Do not wear jewelry, make-up or nail polish.  Do not wear lotions, powders, or perfumes. You may wear deodorant.  Do not shave 48 hours prior to surgery.(face and neck okay, no shaving of legs)  Do not bring valuables to the hospital.  Contacts, dentures or bridgework may not be worn into surgery.  Leave suitcase in the car. After surgery it may be brought to your room.  For patients admitted to the hospital, checkout time is 11:00 AM the day of discharge.   Patients discharged the day of surgery will not be allowed to drive home.  Name and phone number of your driver: spouse  Special Instructions: CHG Shower Use Special Wash: 1/2 bottle night before surgery and 1/2 bottle morning of surgery.(avoid face and genitals)   Please read over the following fact sheets that you were given: MRSA Information, Blood Transfusion fact sheet.

## 2012-01-14 ENCOUNTER — Encounter (HOSPITAL_COMMUNITY)
Admission: RE | Disposition: A | Discharge status: Home / Self Care | Payer: Self-pay | Source: Ambulatory Visit | Attending: Urology

## 2012-01-14 ENCOUNTER — Encounter (HOSPITAL_COMMUNITY): Payer: Self-pay

## 2012-01-14 ENCOUNTER — Inpatient Hospital Stay (HOSPITAL_COMMUNITY)
Admission: RE | Admit: 2012-01-14 | Discharge: 2012-01-17 | DRG: 658 | Disposition: A | Discharge status: Home / Self Care | Payer: Medicare Other | Source: Ambulatory Visit | Attending: Urology | Admitting: Urology

## 2012-01-14 ENCOUNTER — Ambulatory Visit (HOSPITAL_COMMUNITY): Payer: Medicare Other | Admitting: Anesthesiology

## 2012-01-14 ENCOUNTER — Encounter (HOSPITAL_COMMUNITY): Payer: Self-pay | Admitting: Anesthesiology

## 2012-01-14 DIAGNOSIS — N211 Calculus in urethra: Secondary | ICD-10-CM | POA: Diagnosis present

## 2012-01-14 DIAGNOSIS — R972 Elevated prostate specific antigen [PSA]: Secondary | ICD-10-CM | POA: Diagnosis present

## 2012-01-14 DIAGNOSIS — I10 Essential (primary) hypertension: Secondary | ICD-10-CM | POA: Diagnosis present

## 2012-01-14 DIAGNOSIS — E785 Hyperlipidemia, unspecified: Secondary | ICD-10-CM | POA: Diagnosis present

## 2012-01-14 DIAGNOSIS — K219 Gastro-esophageal reflux disease without esophagitis: Secondary | ICD-10-CM | POA: Diagnosis present

## 2012-01-14 DIAGNOSIS — C669 Malignant neoplasm of unspecified ureter: Principal | ICD-10-CM | POA: Diagnosis present

## 2012-01-14 DIAGNOSIS — Z87442 Personal history of urinary calculi: Secondary | ICD-10-CM

## 2012-01-14 DIAGNOSIS — M19019 Primary osteoarthritis, unspecified shoulder: Secondary | ICD-10-CM | POA: Diagnosis present

## 2012-01-14 DIAGNOSIS — C679 Malignant neoplasm of bladder, unspecified: Secondary | ICD-10-CM | POA: Diagnosis present

## 2012-01-14 DIAGNOSIS — R319 Hematuria, unspecified: Secondary | ICD-10-CM | POA: Diagnosis present

## 2012-01-14 HISTORY — PX: URETERAL REIMPLANTION: SHX2611

## 2012-01-14 HISTORY — PX: URETERECTOMY: SHX5147

## 2012-01-14 LAB — BASIC METABOLIC PANEL
CO2: 27 mEq/L (ref 19–32)
Chloride: 105 mEq/L (ref 96–112)
Glucose, Bld: 167 mg/dL — ABNORMAL HIGH (ref 70–99)
Potassium: 4 mEq/L (ref 3.5–5.1)
Sodium: 139 mEq/L (ref 135–145)

## 2012-01-14 LAB — HEMOGLOBIN AND HEMATOCRIT, BLOOD
HCT: 42.2 % (ref 39.0–52.0)
Hemoglobin: 14.1 g/dL (ref 13.0–17.0)

## 2012-01-14 LAB — TYPE AND SCREEN

## 2012-01-14 SURGERY — REIMPLANTATION, URETER
Anesthesia: General | Laterality: Right | Wound class: Clean Contaminated

## 2012-01-14 MED ORDER — BUPIVACAINE-EPINEPHRINE PF 0.25-1:200000 % IJ SOLN
INTRAMUSCULAR | Status: AC
  Filled 2012-01-14: qty 30

## 2012-01-14 MED ORDER — ATENOLOL 50 MG PO TABS
50.0000 mg | ORAL_TABLET | Freq: Every morning | ORAL | Status: DC
  Administered 2012-01-15 – 2012-01-17 (×3): 50 mg via ORAL
  Filled 2012-01-14 (×3): qty 1

## 2012-01-14 MED ORDER — HYDROMORPHONE HCL PF 1 MG/ML IJ SOLN
INTRAMUSCULAR | Status: AC
  Filled 2012-01-14: qty 1

## 2012-01-14 MED ORDER — LACTATED RINGERS IV SOLN
INTRAVENOUS | Status: DC | PRN
  Administered 2012-01-14 (×3): via INTRAVENOUS

## 2012-01-14 MED ORDER — PROPOFOL 10 MG/ML IV BOLUS
INTRAVENOUS | Status: DC | PRN
  Administered 2012-01-14: 170 mg via INTRAVENOUS

## 2012-01-14 MED ORDER — LACTATED RINGERS IV SOLN: INTRAVENOUS | Status: DC

## 2012-01-14 MED ORDER — MEPERIDINE HCL 50 MG/ML IJ SOLN: 6.2500 mg | INTRAMUSCULAR | Status: DC | PRN

## 2012-01-14 MED ORDER — ACETAMINOPHEN 10 MG/ML IV SOLN
INTRAVENOUS | Status: DC | PRN
  Administered 2012-01-14: 1000 mg via INTRAVENOUS

## 2012-01-14 MED ORDER — MIDAZOLAM HCL 5 MG/5ML IJ SOLN
INTRAMUSCULAR | Status: DC | PRN
  Administered 2012-01-14: 2 mg via INTRAVENOUS

## 2012-01-14 MED ORDER — DIPHENHYDRAMINE HCL 12.5 MG/5ML PO ELIX: 12.5000 mg | ORAL_SOLUTION | Freq: Four times a day (QID) | ORAL | Status: DC | PRN

## 2012-01-14 MED ORDER — CEFAZOLIN SODIUM-DEXTROSE 2-3 GM-% IV SOLR
2.0000 g | Freq: Once | INTRAVENOUS | Status: AC
  Administered 2012-01-14: 2 g via INTRAVENOUS

## 2012-01-14 MED ORDER — HYDROCODONE-ACETAMINOPHEN 5-325 MG PO TABS
1.0000 | ORAL_TABLET | ORAL | Status: DC | PRN
  Administered 2012-01-16 – 2012-01-17 (×6): 2 via ORAL
  Filled 2012-01-14 (×6): qty 2

## 2012-01-14 MED ORDER — DIPHENHYDRAMINE HCL 50 MG/ML IJ SOLN: 12.5000 mg | Freq: Four times a day (QID) | INTRAMUSCULAR | Status: DC | PRN

## 2012-01-14 MED ORDER — BUPIVACAINE LIPOSOME 1.3 % IJ SUSP
20.0000 mL | Freq: Once | INTRAMUSCULAR | Status: DC
  Filled 2012-01-14: qty 20

## 2012-01-14 MED ORDER — ACETAMINOPHEN 10 MG/ML IV SOLN
1000.0000 mg | Freq: Four times a day (QID) | INTRAVENOUS | Status: AC
  Administered 2012-01-14 – 2012-01-15 (×4): 1000 mg via INTRAVENOUS
  Filled 2012-01-14 (×5): qty 100

## 2012-01-14 MED ORDER — GLYCOPYRROLATE 0.2 MG/ML IJ SOLN
INTRAMUSCULAR | Status: DC | PRN
  Administered 2012-01-14: 0.6 mg via INTRAVENOUS

## 2012-01-14 MED ORDER — ONDANSETRON HCL 4 MG/2ML IJ SOLN
4.0000 mg | Freq: Four times a day (QID) | INTRAMUSCULAR | Status: DC | PRN
  Administered 2012-01-15 (×2): 4 mg via INTRAVENOUS
  Filled 2012-01-14 (×2): qty 2

## 2012-01-14 MED ORDER — SODIUM CHLORIDE 0.9 % IJ SOLN: 9.0000 mL | INTRAMUSCULAR | Status: DC | PRN

## 2012-01-14 MED ORDER — HYDROMORPHONE HCL PF 1 MG/ML IJ SOLN
INTRAMUSCULAR | Status: DC | PRN
  Administered 2012-01-14 (×2): 1 mg via INTRAVENOUS

## 2012-01-14 MED ORDER — HYDROMORPHONE HCL PF 1 MG/ML IJ SOLN
0.2500 mg | INTRAMUSCULAR | Status: DC | PRN
  Administered 2012-01-14 (×2): 0.5 mg via INTRAVENOUS

## 2012-01-14 MED ORDER — NALOXONE HCL 0.4 MG/ML IJ SOLN: 0.4000 mg | INTRAMUSCULAR | Status: DC | PRN

## 2012-01-14 MED ORDER — SODIUM CHLORIDE 0.9 % IV BOLUS (SEPSIS)
1000.0000 mL | Freq: Once | INTRAVENOUS | Status: AC
  Administered 2012-01-14: 1000 mL via INTRAVENOUS

## 2012-01-14 MED ORDER — STERILE WATER FOR IRRIGATION IR SOLN
Status: DC | PRN
  Administered 2012-01-14: 500 mL

## 2012-01-14 MED ORDER — CEFAZOLIN SODIUM-DEXTROSE 2-3 GM-% IV SOLR
INTRAVENOUS | Status: AC
  Filled 2012-01-14: qty 50

## 2012-01-14 MED ORDER — HYDROMORPHONE 0.3 MG/ML IV SOLN
INTRAVENOUS | Status: DC
  Administered 2012-01-14: 11:00:00 via INTRAVENOUS
  Administered 2012-01-14: 0.9 mg via INTRAVENOUS
  Administered 2012-01-14: 1.8 mg via INTRAVENOUS
  Administered 2012-01-15: 0.9 mg via INTRAVENOUS
  Administered 2012-01-15 (×2): 1.5 mg via INTRAVENOUS
  Administered 2012-01-15: 03:00:00 via INTRAVENOUS
  Administered 2012-01-15: 2.1 mg via INTRAVENOUS
  Administered 2012-01-15: 0.9 mg via INTRAVENOUS
  Administered 2012-01-15: 0.3 mg via INTRAVENOUS
  Administered 2012-01-16: 0.6 mg via INTRAVENOUS
  Administered 2012-01-16: 0.9 mg via INTRAVENOUS
  Administered 2012-01-16: 0.6 mg via INTRAVENOUS
  Filled 2012-01-14 (×2): qty 25

## 2012-01-14 MED ORDER — EPHEDRINE SULFATE 50 MG/ML IJ SOLN
INTRAMUSCULAR | Status: DC | PRN
  Administered 2012-01-14: 10 mg via INTRAVENOUS

## 2012-01-14 MED ORDER — SUCCINYLCHOLINE CHLORIDE 20 MG/ML IJ SOLN
INTRAMUSCULAR | Status: DC | PRN
  Administered 2012-01-14: 100 mg via INTRAVENOUS

## 2012-01-14 MED ORDER — SUFENTANIL CITRATE 50 MCG/ML IV SOLN
INTRAVENOUS | Status: DC | PRN
  Administered 2012-01-14 (×5): 10 ug via INTRAVENOUS

## 2012-01-14 MED ORDER — FAMOTIDINE 20 MG PO TABS
20.0000 mg | ORAL_TABLET | Freq: Every day | ORAL | Status: DC
  Administered 2012-01-14 – 2012-01-17 (×4): 20 mg via ORAL
  Filled 2012-01-14 (×4): qty 1

## 2012-01-14 MED ORDER — ONDANSETRON HCL 4 MG/2ML IJ SOLN
INTRAMUSCULAR | Status: DC | PRN
  Administered 2012-01-14: 4 mg via INTRAVENOUS

## 2012-01-14 MED ORDER — HYDROMORPHONE 0.3 MG/ML IV SOLN
INTRAVENOUS | Status: AC
  Filled 2012-01-14: qty 25

## 2012-01-14 MED ORDER — KCL IN DEXTROSE-NACL 10-5-0.45 MEQ/L-%-% IV SOLN
INTRAVENOUS | Status: DC
  Administered 2012-01-14: 14:00:00 via INTRAVENOUS
  Administered 2012-01-15: 100 mL/h via INTRAVENOUS
  Administered 2012-01-15 – 2012-01-16 (×2): via INTRAVENOUS
  Filled 2012-01-14 (×7): qty 1000

## 2012-01-14 MED ORDER — TAMSULOSIN HCL 0.4 MG PO CAPS
0.4000 mg | ORAL_CAPSULE | Freq: Every evening | ORAL | Status: DC
  Administered 2012-01-14 – 2012-01-16 (×3): 0.4 mg via ORAL
  Filled 2012-01-14 (×4): qty 1

## 2012-01-14 MED ORDER — NEOSTIGMINE METHYLSULFATE 1 MG/ML IJ SOLN
INTRAMUSCULAR | Status: DC | PRN
  Administered 2012-01-14: 4 mg via INTRAVENOUS

## 2012-01-14 MED ORDER — CEFAZOLIN SODIUM 1-5 GM-% IV SOLN
1.0000 g | Freq: Three times a day (TID) | INTRAVENOUS | Status: AC
  Administered 2012-01-14 – 2012-01-15 (×2): 1 g via INTRAVENOUS
  Filled 2012-01-14 (×2): qty 50

## 2012-01-14 MED ORDER — CISATRACURIUM BESYLATE (PF) 10 MG/5ML IV SOLN
INTRAVENOUS | Status: DC | PRN
  Administered 2012-01-14: 2 mg via INTRAVENOUS
  Administered 2012-01-14: 8 mg via INTRAVENOUS
  Administered 2012-01-14 (×2): 4 mg via INTRAVENOUS

## 2012-01-14 MED ORDER — PROMETHAZINE HCL 25 MG/ML IJ SOLN: 6.2500 mg | INTRAMUSCULAR | Status: DC | PRN

## 2012-01-14 MED ORDER — SODIUM CHLORIDE 0.9 % IR SOLN
Status: DC | PRN
  Administered 2012-01-14: 2000 mL

## 2012-01-14 MED ORDER — BELLADONNA ALKALOIDS-OPIUM 16.2-60 MG RE SUPP
1.0000 | Freq: Four times a day (QID) | RECTAL | Status: DC | PRN
  Filled 2012-01-14: qty 1

## 2012-01-14 MED ORDER — BUPIVACAINE HCL (PF) 0.25 % IJ SOLN
INTRAMUSCULAR | Status: DC | PRN
  Administered 2012-01-14: 10 mL

## 2012-01-14 MED ORDER — FINASTERIDE 5 MG PO TABS
5.0000 mg | ORAL_TABLET | Freq: Every morning | ORAL | Status: DC
  Administered 2012-01-15 – 2012-01-17 (×3): 5 mg via ORAL
  Filled 2012-01-14 (×3): qty 1

## 2012-01-14 MED ORDER — LIDOCAINE HCL (CARDIAC) 20 MG/ML IV SOLN
INTRAVENOUS | Status: DC | PRN
  Administered 2012-01-14: 75 mg via INTRAVENOUS

## 2012-01-14 MED ORDER — ACETAMINOPHEN 10 MG/ML IV SOLN
INTRAVENOUS | Status: AC
  Filled 2012-01-14: qty 100

## 2012-01-14 SURGICAL SUPPLY — 54 items
BAG URINE DRAINAGE (UROLOGICAL SUPPLIES) ×2 IMPLANT
BLADE HEX COATED 2.75 (ELECTRODE) ×2 IMPLANT
CATH FOLEY 2WAY SLVR  5CC 18FR (CATHETERS)
CATH FOLEY 2WAY SLVR  5CC 20FR (CATHETERS) ×1
CATH FOLEY 2WAY SLVR 5CC 18FR (CATHETERS) ×1 IMPLANT
CATH FOLEY 2WAY SLVR 5CC 20FR (CATHETERS) IMPLANT
CHLORAPREP W/TINT 26ML (MISCELLANEOUS) ×2 IMPLANT
CLIP LIGATING HEM O LOK PURPLE (MISCELLANEOUS) ×5 IMPLANT
CLIP LIGATING HEMO O LOK GREEN (MISCELLANEOUS) ×5 IMPLANT
CLIP TI LARGE 6 (CLIP) IMPLANT
CLOTH BEACON ORANGE TIMEOUT ST (SAFETY) ×2 IMPLANT
COVER SURGICAL LIGHT HANDLE (MISCELLANEOUS) ×3 IMPLANT
DECANTER SPIKE VIAL GLASS SM (MISCELLANEOUS) IMPLANT
DISSECTOR ROUND CHERRY 3/8 STR (MISCELLANEOUS) ×2 IMPLANT
DRAIN CHANNEL 10F 3/8 F FF (DRAIN) ×2 IMPLANT
DRAPE WARM FLUID 44X44 (DRAPE) ×2 IMPLANT
ELECT NDL TIP 2.8 STRL (NEEDLE) ×1 IMPLANT
ELECT NEEDLE TIP 2.8 STRL (NEEDLE) IMPLANT
ELECT REM PT RETURN 9FT ADLT (ELECTROSURGICAL) ×2
ELECTRODE REM PT RTRN 9FT ADLT (ELECTROSURGICAL) ×1 IMPLANT
EVACUATOR SILICONE 100CC (DRAIN) ×2 IMPLANT
GAUZE SPONGE 4X4 16PLY XRAY LF (GAUZE/BANDAGES/DRESSINGS) ×2 IMPLANT
GLOVE BIOGEL M 8.0 STRL (GLOVE) ×2 IMPLANT
GOWN STRL REIN XL XLG (GOWN DISPOSABLE) ×2 IMPLANT
GUIDEWIRE STR DUAL SENSOR (WIRE) ×1 IMPLANT
KIT BASIN OR (CUSTOM PROCEDURE TRAY) ×2 IMPLANT
LOOP VESSEL MAXI BLUE (MISCELLANEOUS) ×2 IMPLANT
NEEDLE HYPO 22GX1.5 SAFETY (NEEDLE) ×1 IMPLANT
NS IRRIG 1000ML POUR BTL (IV SOLUTION) ×4 IMPLANT
PACK GENERAL/GYN (CUSTOM PROCEDURE TRAY) ×2 IMPLANT
PLUG CATH AND CAP STER (CATHETERS) ×2 IMPLANT
SPONGE GAUZE 4X4 12PLY (GAUZE/BANDAGES/DRESSINGS) ×2 IMPLANT
SPONGE LAP 18X18 X RAY DECT (DISPOSABLE) ×1 IMPLANT
SPONGE LAP 4X18 X RAY DECT (DISPOSABLE) ×1 IMPLANT
STAPLER VISISTAT 35W (STAPLE) ×1 IMPLANT
STENT CONTOUR 6FRX24X.038 (STENTS) ×1 IMPLANT
STRIP CLOSURE SKIN 1/2X4 (GAUZE/BANDAGES/DRESSINGS) IMPLANT
SUCTION FRAZIER TIP 10 FR DISP (SUCTIONS) IMPLANT
SUT CHROMIC 3 0 SH 27 (SUTURE) IMPLANT
SUT CHROMIC 4 0 RB 1X27 (SUTURE) IMPLANT
SUT ETHILON 3 0 PS 1 (SUTURE) ×1 IMPLANT
SUT GUT CHROMIC 3 0 (SUTURE) IMPLANT
SUT PDS AB 1 CTX 36 (SUTURE) ×2 IMPLANT
SUT PDS AB 1 TP1 96 (SUTURE) ×2 IMPLANT
SUT PDS AB 4-0 RB1 27 (SUTURE) ×3 IMPLANT
SUT SILK 4 0 SH CR/8 (SUTURE) ×1 IMPLANT
SUT VIC AB 3-0 SH 27 (SUTURE) ×4
SUT VIC AB 3-0 SH 27XBRD (SUTURE) IMPLANT
SUT VICRYL 3 0 UR 6 27 (SUTURE) ×3 IMPLANT
SYR CONTROL 10ML LL (SYRINGE) ×1 IMPLANT
SYRINGE 10CC LL (SYRINGE) ×2 IMPLANT
TOWEL OR 17X26 10 PK STRL BLUE (TOWEL DISPOSABLE) ×2 IMPLANT
TUBE FEEDING 5FR 36IN KANGAROO (TUBING) ×2 IMPLANT
WATER STERILE IRR 1500ML POUR (IV SOLUTION) ×1 IMPLANT

## 2012-01-14 NOTE — Progress Notes (Signed)
Day of Surgery Subjective: Patient reports good pain control with PCA.  Drinking water only so far.  Denies N/V.  Objective: Vital signs in last 24 hours: Temp:  [97.3 F (36.3 C)-98.5 F (36.9 C)] 97.5 F (36.4 C) (06/03 1320) Pulse Rate:  [55-68] 62  (06/03 1320) Resp:  [9-20] 18  (06/03 1320) BP: (121-153)/(65-94) 121/76 mmHg (06/03 1320) SpO2:  [95 %-100 %] 97 % (06/03 1320) Weight:  [92.534 kg (204 lb)] 92.534 kg (204 lb) (06/03 1320)  Intake/Output from previous day:   Intake/Output this shift: Total I/O In: 2900 [I.V.:2900] Out: 620 [Urine:250; Drains:20; Blood:350]  Physical Exam:  General:alert, cooperative and no distress GI: soft Urine: pink  Lab Results:  Basename 01/14/12 1103  HGB 14.1  HCT 42.2   BMET  Basename 01/14/12 1103  NA 139  K 4.0  CL 105  CO2 27  GLUCOSE 167*  BUN 14  CREATININE 0.65  CALCIUM 8.4   No results found for this basename: LABPT:3,INR:3 in the last 72 hours No results found for this basename: LABURIN:1 in the last 72 hours Results for orders placed during the hospital encounter of 01/09/12  SURGICAL PCR SCREEN     Status: Normal   Collection Time   01/09/12 11:25 AM      Component Value Range Status Comment   MRSA, PCR NEGATIVE  NEGATIVE  Final    Staphylococcus aureus NEGATIVE  NEGATIVE  Final     Studies/Results: No results found.  Assessment/Plan: Day of Surgery Procedure(s) (LRB): URETERAL REIMPLANT (Right) URETERECTOMY (Right)  Doing well  Start I.S  Clears and advance as tolerated  Bedrest tonight   LOS: 0 days   YARBROUGH,Nakesha Ebrahim G. 01/14/2012, 3:17 PM

## 2012-01-14 NOTE — Anesthesia Preprocedure Evaluation (Addendum)
Anesthesia Evaluation  Patient identified by MRN, date of birth, ID band Patient awake    Reviewed: Allergy & Precautions, H&P , NPO status , Patient's Chart, lab work & pertinent test results, reviewed documented beta blocker date and time   History of Anesthesia Complications Negative for: history of anesthetic complications  Airway Mallampati: III TM Distance: >3 FB Neck ROM: Full    Dental  (+) Edentulous Upper   Pulmonary neg pulmonary ROS,  breath sounds clear to auscultation        Cardiovascular hypertension, Pt. on medications and Pt. on home beta blockers negative cardio ROS  Rhythm:Regular     Neuro/Psych negative neurological ROS  negative psych ROS   GI/Hepatic negative GI ROS, Neg liver ROS, hiatal hernia, GERD-  Medicated,  Endo/Other  negative endocrine ROSMorbid obesity  Renal/GU negative Renal ROS  negative genitourinary   Musculoskeletal negative musculoskeletal ROS (+)   Abdominal   Peds  Hematology negative hematology ROS (+)   Anesthesia Other Findings   Reproductive/Obstetrics negative OB ROS                           Anesthesia Physical  Anesthesia Plan  ASA: II  Anesthesia Plan: General   Post-op Pain Management:    Induction: Intravenous  Airway Management Planned: Oral ETT and LMA  Additional Equipment:   Intra-op Plan:   Post-operative Plan:   Informed Consent: I have reviewed the patients History and Physical, chart, labs and discussed the procedure including the risks, benefits and alternatives for the proposed anesthesia with the patient or authorized representative who has indicated his/her understanding and acceptance.   Dental advisory given  Plan Discussed with: CRNA  Anesthesia Plan Comments:        Anesthesia Quick Evaluation

## 2012-01-14 NOTE — Discharge Instructions (Signed)
1. Activity:  You are encouraged to ambulate frequently (about every hour during waking hours) to help prevent blood clots from forming in your legs or lungs.  However, you should not engage in any heavy lifting (> 10-15 lbs), strenuous activity, or straining. 2. Prescriptions:  You will be provided a prescription for pain medication to take as needed.  If your pain is not severe enough to require the prescription pain medication, you may take extra strength Tylenol instead.  You should also take an over the counter stool softener (Colace 100 mg twice daily) to avoid straining with bowel movements as the pain medication may constipate you.  3. Catheter care: You will be taught how to take care of the catheter by the nursing staff prior to discharge from the hospital.  You may use both a leg bag and the larger bedside bag but it is recommended to at least use the bigger bedside bag at nighttime as the leg bag is small and will fill up overnight and also does not drain as well when lying flat. You may periodically feel a strong urge to void with the catheter in place.  This is a bladder spasm and most often can occur when having a bowel movement or when you are moving around. It is typically self-limited and usually will stop after a few minutes.  You may use some Vaseline or Neosporin around the tip of the catheter to reduce friction at the tip of the penis. 4. Incisions: You may remove your dressing bandages the 2nd day after surgery.  You most likely will have a few small staples in each of the incisions and once the bandages are removed, the incisions may stay open to air.  You may start showering (not soaking or bathing in water) 48 hours after surgery and the incisions simply need to be patted dry after the shower.  No additional care is needed. 5. What to call us about: You should call the office [phone]) if you develop fever > 101, persistent vomiting, or the catheter stops draining. Also, feel free  to call with any other questions you may have and remember the handout that was provided to you as a reference preoperatively which answers many of the common questions that arise after surgery.

## 2012-01-14 NOTE — Addendum Note (Signed)
Addendum  created 01/14/12 1019 by Phillips Grout, MD   Modules edited:Orders, PRL Based Order Sets

## 2012-01-14 NOTE — Progress Notes (Signed)
Patient ID: Frank Delacruz, male   DOB: Jul 17, 1946, 66 y.o.   MRN: [id number] Post-op note  Subjective: The patient is doing well.  No complaints.  Objective: Vital signs in last 24 hours: Temp:  [97.3 F (36.3 C)-98.5 F (36.9 C)] 97.5 F (36.4 C) (06/03 1320) Pulse Rate:  [55-68] 62  (06/03 1320) Resp:  [9-20] 18  (06/03 1716) BP: (121-153)/(65-94) 121/76 mmHg (06/03 1320) SpO2:  [95 %-100 %] 95 % (06/03 1716) Weight:  [92.534 kg (204 lb)] 92.534 kg (204 lb) (06/03 1320)  Intake/Output from previous day:   Intake/Output this shift: Total I/O In: 3150 [P.O.:250; I.V.:2900] Out: 1120 [Urine:750; Drains:20; Blood:350]  Physical Exam:  General: Alert and oriented. Abdomen: Soft, Nondistended. Incisions: Clean and dry.  Lab Results:  Basename 01/14/12 1103  HGB 14.1  HCT 42.2    Assessment/Plan: POD#0   1) Continue to monitor   Bertram Millard. Oretha Weismann, MD   LOS: 0 days   Chelsea Aus 01/14/2012, 6:45 PM

## 2012-01-14 NOTE — H&P (Signed)
Urology History and Physical Exam  CC: Ureteral cancer  HPI: 66 year old male presents for right distal ureterectomy. He has the following history:   His history started in January, 2012. At that time, he presented with a right-sided distal ureteral stone which needed ureteroscopy and extraction. At the time of that anesthetic procedure, he was found to have a left-sided bladder tumor. This was resected by Dr. Baldo Ash in [place], [place]. Pathology revealed high-grade papillary urothelial carcinoma without evidence of lamina propria or muscular invasion. Because of the high-grade nature of his disease, he underwent induction BCG therapy. Following this, he underwent repeat anesthetic cystoscopy and TURBT of a recurrent tumor on 02/08/2011. He underwent a second induction BCG series, which was completed by September, 2012. Followup cystoscopy in September and December 2012 were clear.  Dr. Baldo Ash left the region, and the patient at this point comes back for further surveillance. He has some dysuria, and significant lower urinary tract symptoms including frequency, urgency, occasional dysuria, nocturia, hesitancy and intermittency. However, he has not had recurrent gross hematuria.  Medical history significant for urolithiasis both in 2012 and one previous episode, arthritis, GERD, gout, hypertension.  He is on both finasteride and tamsulosin for his BPH symptoms.   He underwent cystoscopy bilateral retrograde ureteropyelograms, bladder biopsy and right ureteroscopy on 11/21/2011. Bladder biopsies were negative fo urothelial carcinoma. Left retrograde was normal, but right revealed a significant filling defect in the right lower ureter. Ureteroscopy confirmed papillary nature of a urothelial neoplasm. Biopsy of the ureter revealed high-grade urothelial neoplasm. CT revealed no evidence of renal abnormalities, but the filling defect in the right distal ureter.   PMH: Past Medical History  Diagnosis  Date  . Hyperlipidemia, mild   . Increased prostate specific antigen (PSA) velocity   . Hypertension   . Urothelial carcinoma of bladder  . H/O hiatal hernia   . GERD (gastroesophageal reflux disease) occasionlly takes zantac  . Arthritis of shoulder region, left     lt. shoulder  . History of kidney stones   . Frequency of urination   . Urgency of urination   . Nocturia   . Hematuria     PSH: Past Surgical History  Procedure Date  . Anterior cervical decomp/discectomy fusion 03-09-2004    C5 - 6/ plates and screws retained  . Echo/ cardiology stress test 04-22-2009  DR [facility]    NORMAL  . Ureterolithotomy MARCH 2012-- [facility]  . Cysto/  resection bladder lesion with bx JULY 2012-- [facility]  . Rotator cuff repair 1997    LEFT  . Inguinal hernia repair 2006    RIGHT  . Cystoscopy w/ retrogrades 11/21/2011    Procedure: CYSTOSCOPY WITH RETROGRADE PYELOGRAM;  Surgeon: Marcine Matar, MD;  Location[facility];  Service: Urology;  Laterality: Bilateral;  BLADDER BX  1 hour requested for this case      Allergies: Allergies  Allergen Reactions  . Meloxicam Nausea And Vomiting  . Naproxen Nausea And Vomiting    Medications: Prescriptions prior to admission  Medication Sig Dispense Refill  . atenolol (TENORMIN) 50 MG tablet Take 50 mg by mouth every morning.       . finasteride (PROSCAR) 5 MG tablet Take 5 mg by mouth every morning.      . fish oil-omega-3 fatty acids 1000 MG capsule Take 2 g by mouth daily.      . Ranitidine HCl (ZANTAC PO) Take 75 mg by mouth daily as needed. For acid reflux       .  Tamsulosin HCl (FLOMAX) 0.4 MG CAPS Take 0.4 mg by mouth every evening.         Social History: History   Social History  . Marital Status: Married    Spouse Name: N/A    Number of Children: N/A  . Years of Education: N/A   Occupational History  . Not on file.   Social History Main Topics  . Smoking status: Former Smoker    Types: Cigarettes     Quit date: 01/08/1989  . Smokeless tobacco: Current User    Types: Chew   Comment: CHEWED TOBACCO FOR APPROX. 50 YRS - STATES HAS CUT-DOWN SINCE MARCH 2012  . Alcohol Use: No  . Drug Use: No  . Sexually Active: Yes   Other Topics Concern  . Not on file   Social History Narrative  . No narrative on file    Family History: Family History  Problem Relation Age of Onset  . Stroke Mother   . Cancer Father     Review of Systems: Genitourinary, constitutional, skin, eye, otolaryngeal, hematologic/lymphatic, cardiovascular, pulmonary, endocrine, musculoskeletal, gastrointestinal, neurological and psychiatric system(s) were reviewed and pertinent findings if present are noted.  Genitourinary: urinary frequency, feelings of urinary urgency, dysuria, nocturia, incontinence, weak urinary stream, urinary stream starts and stops, erectile dysfunction and initiating urination requires straining.  Gastrointestinal: abdominal pain and constipation.  Constitutional: feeling tired (fatigue).  Musculoskeletal: back pain and joint pain.  Neurological: headache.     Physical Exam: @VITALS2 @ Constitutional: Well nourished and well developed . No acute distress.  ENT:. The ears and nose are normal in appearance.  Neck: The appearance of the neck is normal and no neck mass is present.  Pulmonary: No respiratory distress and normal respiratory rhythm and effort.  Cardiovascular: Heart rate and rhythm are normal . No peripheral edema.  Abdomen: The abdomen is obese. The abdomen is soft and nontender. No masses are palpated. No CVA tenderness. No hernias are palpable. No hepatosplenomegaly noted.  Lymphatics: The anterior cervical, supraclavicular and axillary nodes are not enlarged or tender.  Skin: Normal skin turgor, no visible rash and no visible skin lesions.  Neuro/Psych:. Mood and affect are appropriate.   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

## 2012-01-14 NOTE — Op Note (Signed)
Preoperative diagnosis: Right distal ureteral urothelial carcinoma  Postoperative diagnosis: Same  Principal procedure: Right distal ureterectomy, right ureteral reimplant  Surgeon: Daevon Holdren  First assistant: Myer Haff, PA-C.  Anesthesia: Gen. endotracheal  Complications: None  Specimen: Right distal ureter, right distal ureteral margin  Brief history: 66 year-old male with biopsy-proven urothelial carcinoma of the right distal ureter. This was diagnosed recently on followup for urothelial carcinoma the bladder. He was found to have a 2-3 cm long area of carcinoma in his right distal ureter just proximal to the UVJ. He presents at this time for definitive surgical management. Risks and complications of the procedure have been discussed with the patient and his wife on a couple occasions. These include but are not limited to bleeding, need for transfusion, infection, ureteral stricture, urine leak, DVT, PE, other cardiac complications, among others. He understands these and desires to proceed.  Description of procedure: The patient was identified and properly marked in the holding area. He was taken the operating room where general endotracheal anesthetic was administered. He was administered preoperative antibiotics as well. He was placed in the supine position. Genitalia perineum as well as lower abdomen were prepped and draped. Appropriate time out was then performed.  An incision was made from the pubis to his umbilicus in the midline, and the incision was carried into the peritoneum. Brief inspection of the pelvis revealed no significant pathology. Foley catheter was placed within the bladder. The bladder was drained. The Bookwalter retractor was placed, and the colon packed away. The right ureter was easily identified in the right pelvis, and the peritoneum was scored in the ureter was dissected from the pelvic brim down to the hiatus with the bladder. It was evident that there was some  dilatation of the right distal ureter just at the hiatus. Following adequate dissection of the ureter, the bladder was opened in the midline anteriorly. I then placed a 5 [place] feeding tube through the right ureteral orifice, although I into the ureter, and this was sutured in place at the orifice with a Vicryl suture. Dissection was then made circumferentially around the right ureteral orifice, and the dissection was then carried proximally through the muscular wall of the bladder, along the ureteral sheath. Once the dissection was carried proximally approximately 3 cm, I inspected the external side of the prior ureteral dissection. There was still a bit more dissection and needed to be done. This was done externally, and I also divided more the detrusor muscle. Following this, the ureter was fully dissected from the bladder. An approximate 4 cm section of the ureter was sent for permanent section. The distal ureter was sent sent, an approximate 3 cm segment, for frozen section. This returned no evidence of dysplasia or neoplasia. The distal ureter was inspected later. I pick the best location for the reimplant. The right dome of the bladder, slightly posterior tunnel was felt to be the best location for the reimplantation with the least amount of tension on the ureter. I did not feel we needed to do a psoas hitch or a Boari flap. The ureter was then brought through the right dome of the bladder with a right angle. A stay suture was placed on the end of the ureter. The ureter was spatulated. The mucosa and muscularis layer of the bladder was opened a bit more inside the bladder for the reimplantation. The distal end of the ureter was then sutured to the inferior margin of the cut bladder. A 4-0 PDS was used for  this. Several more 4-0 PDS sutures were placed in simple interrupted fashion around the ureter for the reanastomosis. In this manner, there was a nicely spatulated, wide open ureteroneocystostomy. A 7  French, 24 cm double-J stent was then placed with adequate positioning seen both inside the bladder, and inspecting the ureter from the outside. The ureter was sutured to the seromuscular layer of the bladder on the peritoneal surface of the bladder to help secure it. A 4-0 PDS was used for this. Following adequate reanastomosis, the bladder was closed in 2 separate layers with 3-0 Vicryl suture. Following this, the bladder was filled with 150 cc of saline. No leak was seen either from the ureteroneocystostomy or from the bladder closure. A 10 [place] Blake drain was brought through a separate stab incision in the right lower quadrant, sutured to the skin with a 3-0 nylon. He was placed anterior to the bladder and in the right pelvis. The pelvis was irrigated, the fascial edges were reapproximated using a #1 PDS placed in a simple running fashion. Skin edges were reapproximated with skin clips.  At this point, sponge needle and instrument counts were correct x2. Blood loss was approximately 350 cc. Wound was then dressed , and the patient awakened and taken to PACU in stable condition.

## 2012-01-14 NOTE — Preoperative (Signed)
Beta Blockers   Reason not to administer Beta Blockers:Took Tenormin 50 mg po this am.

## 2012-01-14 NOTE — Interval H&P Note (Signed)
History and Physical Interval Note:  01/14/2012 7:18 AM  Frank Delacruz  has presented today for surgery, with the diagnosis of RIGHT URETERAL TUMOR  The various methods of treatment have been discussed with the patient and family. After consideration of risks, benefits and other options for treatment, the patient has consented to  Procedure(s) (LRB): URETERAL REIMPLANT (Right) URETERECTOMY (Right) as a surgical intervention .  The patients' history has been reviewed, patient examined, no change in status, stable for surgery.  I have reviewed the patients' chart and labs.  Questions were answered to the patient's satisfaction.     Chelsea Aus

## 2012-01-14 NOTE — Transfer of Care (Signed)
Immediate Anesthesia Transfer of Care Note  Patient: Frank Delacruz  Procedure(s) Performed: Procedure(s) (LRB): URETERAL REIMPLANT (Right) URETERECTOMY (Right)  Patient Location: PACU  Anesthesia Type: General  Level of Consciousness: awake, alert , oriented and patient cooperative  Airway & Oxygen Therapy: Patient Spontanous Breathing and Patient connected to face mask oxygen  Post-op Assessment: Report given to PACU RN and Post -op Vital signs reviewed and stable  Post vital signs: Reviewed and stable  Complications: No apparent anesthesia complications

## 2012-01-14 NOTE — Anesthesia Postprocedure Evaluation (Signed)
  Anesthesia Post-op Note  Patient: Frank Delacruz  Procedure(s) Performed: Procedure(s) (LRB): URETERAL REIMPLANT (Right) URETERECTOMY (Right)  Patient Location: PACU  Anesthesia Type: General  Level of Consciousness: awake and alert   Airway and Oxygen Therapy: Patient Spontanous Breathing  Post-op Pain: mild  Post-op Assessment: Post-op Vital signs reviewed, Patient's Cardiovascular Status Stable, Respiratory Function Stable, Patent Airway and No signs of Nausea or vomiting  Post-op Vital Signs: stable  Complications: No apparent anesthesia complications

## 2012-01-15 ENCOUNTER — Encounter (HOSPITAL_COMMUNITY): Payer: Self-pay | Admitting: Urology

## 2012-01-15 LAB — BASIC METABOLIC PANEL
CO2: 27 mEq/L (ref 19–32)
Calcium: 8.2 mg/dL — ABNORMAL LOW (ref 8.4–10.5)
Creatinine, Ser: 0.65 mg/dL (ref 0.50–1.35)
GFR calc non Af Amer: >90 mL/min (ref >90)
Glucose, Bld: 206 mg/dL — ABNORMAL HIGH (ref 70–99)

## 2012-01-15 LAB — HEMOGLOBIN AND HEMATOCRIT, BLOOD: Hemoglobin: 12.9 g/dL — ABNORMAL LOW (ref 13.0–17.0)

## 2012-01-15 MED ORDER — BISACODYL 10 MG RE SUPP
10.0000 mg | Freq: Once | RECTAL | Status: AC
  Administered 2012-01-15: 10 mg via RECTAL
  Filled 2012-01-15: qty 1

## 2012-01-15 NOTE — Progress Notes (Addendum)
1 Day Post-Op Subjective: Patient reports mild nausea and pain control good this morning.  Denies vomiting.  Using incentive.  Objective: Vital signs in last 24 hours: Temp:  [97.4 F (36.3 C)-98.8 F (37.1 C)] 98.3 F (36.8 C) (06/04 0458) Pulse Rate:  [55-68] 59  (06/04 0458) Resp:  [8-18] 17  (06/04 0745) BP: (111-153)/(65-94) 123/79 mmHg (06/04 0458) SpO2:  [94 %-100 %] 97 % (06/04 0745) FiO2 (%):  [2 %] 2 % (06/04 0412) Weight:  [92.534 kg (204 lb)] 92.534 kg (204 lb) (06/03 1320)  Intake/Output from previous day: 06/03 0701 - 06/04 0700 In: 4400 [P.O.:250; I.V.:4150] Out: 2465 [Urine:2075; Drains:40; Blood:350]  Physical Exam:  General:alert, cooperative and no distress Cardiac: RRR Lungs: CTA bilat GI: soft; mildly tender; faint BS; some distention Incision: dressing C/D/I  JP in place Urine: yellow Ext: SCDs in place   Lab Results:  Basename 01/15/12 0435 01/14/12 1103  HGB 12.9* 14.1  HCT 40.1 42.2   BMET  Basename 01/15/12 0435 01/14/12 1103  NA 136 139  K 3.7 4.0  CL 102 105  CO2 27 27  GLUCOSE 206* 167*  BUN 8 14  CREATININE 0.65 0.65  CALCIUM 8.2* 8.4   No results found for this basename: LABPT:3,INR:3 in the last 72 hours No results found for this basename: LABURIN:1 in the last 72 hours Results for orders placed during the hospital encounter of 01/09/12  SURGICAL PCR SCREEN     Status: Normal   Collection Time   01/09/12 11:25 AM      Component Value Range Status Comment   MRSA, PCR NEGATIVE  NEGATIVE  Final    Staphylococcus aureus NEGATIVE  NEGATIVE  Final    Assessment/Plan: 1 Day Post-Op Procedure(s) (LRB): URETERAL REIMPLANT (Right) URETERECTOMY (Right)  Ambulate  Continue clears until nausea resolves  Wean O2  I.S  Dulcolax suppository  Leave JP until d/c  Pt will go home with foley  Likely d/c PCA in a.m. and switch to PO pain meds   LOS: 1 day   YARBROUGH,Brinda Focht G. 01/15/2012, 8:11 AM

## 2012-01-15 NOTE — Progress Notes (Signed)
Inpatient Diabetes Program Recommendations  AACE/ADA: New Consensus Statement on Inpatient Glycemic Control (2009)  Target Ranges:  Prepandial:   less than 140 mg/dL      Peak postprandial:   less than 180 mg/dL (1-2 hours)      Critically ill patients:  140 - 180 mg/dL   Reason for Visit: No history of diabetes noted.  Lab glucose this AM =206 mg/dL.  Consider checking A1C to determine pre-hospitalization glycemic control. Also may consider checking CBG's tid wc and HS to determine glycemic control while in the hospital.  If greater than 150 mg/dL, consider adding correction Novolog.

## 2012-01-15 NOTE — Progress Notes (Signed)
Pt. sitting at the edge of the bed prior to ambulation patient bacame nauseated and vomited  brownish  emesis in moderate amount. PRN meds given , pt verbalized relief up on the chair at this time, will try to ambulate this afternoon.

## 2012-01-15 NOTE — Progress Notes (Signed)
Able to ambulate about 85ft this afternoon,no complaints of nausea or vomiting.

## 2012-01-16 NOTE — Progress Notes (Signed)
2 Days Post-Op Subjective: Patient reports that he is feeling fine. He had a BM this am. Emesis x 2 yesterday but no nausea today  Objective: Vital signs in last 24 hours: Temp:  [98.6 F (37 C)-99.8 F (37.7 C)] 98.6 F (37 C) (06/05 0621) Pulse Rate:  [66-76] 66  (06/05 0621) Resp:  [11-17] 14  (06/05 0816) BP: (115-125)/(67-74) 115/67 mmHg (06/05 0621) SpO2:  [96 %-100 %] 96 % (06/05 0816)  Intake/Output from previous day: 06/04 0701 - 06/05 0700 In: 669 [P.O.:669] Out: 4410 [Urine:4375; Drains:35] Intake/Output this shift: Total I/O In: -  Out: 7 [Drains:7]  Physical Exam:  Constitutional: Vital signs reviewed. WD WN in NAD   Eyes: PERRL, No scleral icterus.   Cardiovascular: RRR Pulmonary/Chest: Normal effort, clear BS Abdominal: Soft. Non-tender, non-distended, bowel sounds are normal, no masses, organomegaly, or guarding present. Incision w/ slight erythema no d/c. Genitourinary:penile edema Extremities: No cyanosis or edema   Lab Results:  Basename 01/15/12 0435 01/14/12 1103  HGB 12.9* 14.1  HCT 40.1 42.2   BMET  Basename 01/15/12 0435 01/14/12 1103  NA 136 139  K 3.7 4.0  CL 102 105  CO2 27 27  GLUCOSE 206* 167*  BUN 8 14  CREATININE 0.65 0.65  CALCIUM 8.2* 8.4   No results found for this basename: LABPT:3,INR:3 in the last 72 hours No results found for this basename: LABURIN:1 in the last 72 hours Results for orders placed during the hospital encounter of 01/09/12  SURGICAL PCR SCREEN     Status: Normal   Collection Time   01/09/12 11:25 AM      Component Value Range Status Comment   MRSA, PCR NEGATIVE  NEGATIVE  Final    Staphylococcus aureus NEGATIVE  NEGATIVE  Final     Studies/Results: No results found.  Assessment/Plan:   Postoperative day #2 from right distal ureterectomy. He is doing well. There is only slight peri-incisional erythema, no discharge.    We will saline lock his IV, change his pain medicines by mouth, advance his  diet.   LOS: 2 days   Marcine Matar M 01/16/2012, 8:19 AM

## 2012-01-16 NOTE — Progress Notes (Signed)
Report received from A. [place],RN. No change in assessment. Frank Delacruz 

## 2012-01-16 NOTE — Progress Notes (Signed)
Noticed urine output slowing down and noticed blood clots on his foley tube while patient complained of pressure pain. Hand irrigated foley catheter with NS and took out large amount of blood clots. After which bloody urine drained out after irrigation. Will continue to monitor patient.

## 2012-01-17 MED ORDER — HYDROCODONE-ACETAMINOPHEN 5-325 MG PO TABS: 1.0000 | ORAL_TABLET | ORAL | Status: AC | PRN

## 2012-01-17 NOTE — Plan of Care (Signed)
Problem: Phase III Progression Outcomes Goal: Voiding independently Outcome: Not Applicable Date Met:  01/17/12 Pt to be discharged home with foley

## 2012-01-17 NOTE — Plan of Care (Signed)
Problem: Discharge Progression Outcomes Goal: Staples/sutures removed Outcome: Not Applicable Date Met:  01/17/12 To be dc'd in MD office next week

## 2012-01-17 NOTE — Discharge Summary (Signed)
Patient ID: Frank Delacruz MRN: [id number] DOB/AGE: [birth year] 66 y.o.  Admit date: 01/14/2012 Discharge date: 01/17/2012  Primary Care Physician:  Josue Hector, MD, MD  Discharge Diagnoses:   Present on Admission:  **None**  Consults:  None    Discharge Medications: Medication List  As of 01/17/2012  7:44 AM   STOP taking these medications         BC HEADACHE POWDER PO         TAKE these medications         atenolol 50 MG tablet   Commonly known as: TENORMIN   Take 50 mg by mouth every morning.      finasteride 5 MG tablet   Commonly known as: PROSCAR   Take 5 mg by mouth every morning.      fish oil-omega-3 fatty acids 1000 MG capsule   Take 2 g by mouth daily.      HYDROcodone-acetaminophen 5-325 MG per tablet   Commonly known as: NORCO   Take 1-2 tablets by mouth every 4 (four) hours as needed.      Tamsulosin HCl 0.4 MG Caps   Commonly known as: FLOMAX   Take 0.4 mg by mouth every evening.      ZANTAC PO   Take 75 mg by mouth daily as needed. For acid reflux               Significant Diagnostic Studies:  No results found.  Brief H and P: For complete details please refer to admission H and P, but in brief the patient was admitted for right distal ureterectomy. This was performed in the day of admission. He had an uncomplicated postoperative course and was discharged home on postoperative day #3.  Hospital Course:  Active Problems:  * No active hospital problems. *    Day of Discharge BP 110/67  Pulse 63  Temp(Src) 98 F (36.7 C) (Oral)  Resp 16  Ht 5\' 6"  (1.676 m)  Wt 92.534 kg (204 lb)  BMI 32.93 kg/m2  SpO2 92%  No results found for this or any previous visit (from the past 24 hour(s)).  Physical Exam: General: Alert and awake oriented x3 not in any acute distress. HEENT: anicteric sclera, pupils reactive to light and accommodation CVS: S1-S2 clear no murmur rubs or gallops Chest: clear to auscultation bilaterally, no  wheezing rales or rhonchi Abdomen: soft nontender, nondistended, normal bowel sounds, no organomegaly Extremities: no cyanosis, clubbing or edema noted bilaterally Neuro: Cranial nerves II-XII intact, no focal neurological deficits  Disposition: Home  Diet: No restrictions  Activity: Discussed with patient   Disposition and Follow-up:   One week for staple removal  TESTS THAT NEED FOLLOW-UP Cystogram  DISCHARGE FOLLOW-UP Follow-up Information    Follow up with Chelsea Aus, MD on 01/24/2012. (at 8:00)    Contact information:   [address] [place] [address] [phone]          Time spent on Discharge: 15 minutes  Signed: Chelsea Aus 01/17/2012, 7:44 AM

## 2012-01-17 NOTE — Care Management Note (Signed)
    Page 1 of 1   01/17/2012     12:24:19 PM   CARE MANAGEMENT NOTE 01/17/2012  Patient:  Frank Delacruz, Frank Delacruz   Account Number:  [id number]  Date Initiated:  01/17/2012  Documentation initiated by:  Lanier Clam  Subjective/Objective Assessment:   ADMITTED W/URETERAL IMPLANT     Action/Plan:   FROM HOME   Anticipated DC Date:  01/17/2012   Anticipated DC Plan:  HOME/SELF CARE      DC Planning Services  CM consult      Choice offered to / List presented to:             Status of service:  Completed, signed off Medicare Important Message given?   (If response is "NO", the following Medicare IM given date fields will be blank) Date Medicare IM given:   Date Additional Medicare IM given:    Discharge Disposition:  HOME/SELF CARE  Per UR Regulation:  Reviewed for med. necessity/level of care/duration of stay  If discussed at Long Length of Stay Meetings, dates discussed:    Comments:

## 2012-01-21 ENCOUNTER — Other Ambulatory Visit: Payer: Self-pay | Admitting: Urology

## 2012-01-21 DIAGNOSIS — C669 Malignant neoplasm of unspecified ureter: Secondary | ICD-10-CM

## 2012-01-23 ENCOUNTER — Ambulatory Visit (HOSPITAL_COMMUNITY)
Admission: RE | Admit: 2012-01-23 | Discharge: 2012-01-23 | Disposition: A | Discharge status: Home / Self Care | Payer: Medicare Other | Source: Ambulatory Visit | Attending: Urology | Admitting: Urology

## 2012-01-23 DIAGNOSIS — C669 Malignant neoplasm of unspecified ureter: Secondary | ICD-10-CM | POA: Insufficient documentation

## 2012-01-23 DIAGNOSIS — N1370 Vesicoureteral-reflux, unspecified: Secondary | ICD-10-CM | POA: Insufficient documentation

## 2012-01-23 MED ORDER — DIATRIZOATE MEGLUMINE 30 % UR SOLN: Freq: Once | URETHRAL | Status: AC | PRN

## 2012-02-05 ENCOUNTER — Other Ambulatory Visit: Payer: Self-pay | Admitting: Urology

## 2012-02-05 ENCOUNTER — Ambulatory Visit (INDEPENDENT_AMBULATORY_CARE_PROVIDER_SITE_OTHER): Payer: Medicare Other | Admitting: Urology

## 2012-02-05 DIAGNOSIS — C801 Malignant (primary) neoplasm, unspecified: Secondary | ICD-10-CM

## 2012-02-05 DIAGNOSIS — C669 Malignant neoplasm of unspecified ureter: Secondary | ICD-10-CM

## 2012-02-05 DIAGNOSIS — C679 Malignant neoplasm of bladder, unspecified: Secondary | ICD-10-CM

## 2012-02-08 ENCOUNTER — Ambulatory Visit (HOSPITAL_COMMUNITY)
Admission: RE | Admit: 2012-02-08 | Discharge: 2012-02-08 | Disposition: A | Discharge status: Home / Self Care | Payer: Medicare Other | Source: Ambulatory Visit | Attending: Urology | Admitting: Urology

## 2012-02-08 DIAGNOSIS — C801 Malignant (primary) neoplasm, unspecified: Secondary | ICD-10-CM

## 2012-02-08 DIAGNOSIS — Z9889 Other specified postprocedural states: Secondary | ICD-10-CM | POA: Insufficient documentation

## 2012-02-08 DIAGNOSIS — C669 Malignant neoplasm of unspecified ureter: Secondary | ICD-10-CM | POA: Insufficient documentation

## 2012-02-22 ENCOUNTER — Ambulatory Visit (INDEPENDENT_AMBULATORY_CARE_PROVIDER_SITE_OTHER): Payer: Medicare Other | Admitting: Urology

## 2012-02-22 DIAGNOSIS — Z8551 Personal history of malignant neoplasm of bladder: Secondary | ICD-10-CM

## 2012-02-29 ENCOUNTER — Ambulatory Visit (INDEPENDENT_AMBULATORY_CARE_PROVIDER_SITE_OTHER): Payer: Medicare Other | Admitting: Urology

## 2012-02-29 DIAGNOSIS — N390 Urinary tract infection, site not specified: Secondary | ICD-10-CM

## 2012-02-29 DIAGNOSIS — R112 Nausea with vomiting, unspecified: Secondary | ICD-10-CM

## 2012-03-07 ENCOUNTER — Ambulatory Visit (INDEPENDENT_AMBULATORY_CARE_PROVIDER_SITE_OTHER): Payer: Medicare Other | Admitting: Urology

## 2012-03-07 DIAGNOSIS — C670 Malignant neoplasm of trigone of bladder: Secondary | ICD-10-CM

## 2012-03-14 ENCOUNTER — Other Ambulatory Visit: Payer: Self-pay | Admitting: Urology

## 2012-03-14 ENCOUNTER — Ambulatory Visit (INDEPENDENT_AMBULATORY_CARE_PROVIDER_SITE_OTHER): Payer: Medicare Other | Admitting: Urology

## 2012-03-14 ENCOUNTER — Ambulatory Visit (HOSPITAL_COMMUNITY)
Admission: RE | Admit: 2012-03-14 | Discharge: 2012-03-14 | Disposition: A | Discharge status: Home / Self Care | Payer: Medicare Other | Source: Ambulatory Visit | Attending: Urology | Admitting: Urology

## 2012-03-14 DIAGNOSIS — R112 Nausea with vomiting, unspecified: Secondary | ICD-10-CM | POA: Insufficient documentation

## 2012-03-14 DIAGNOSIS — C669 Malignant neoplasm of unspecified ureter: Secondary | ICD-10-CM

## 2012-03-14 DIAGNOSIS — Z8559 Personal history of malignant neoplasm of other urinary tract organ: Secondary | ICD-10-CM | POA: Insufficient documentation

## 2012-03-14 DIAGNOSIS — N390 Urinary tract infection, site not specified: Secondary | ICD-10-CM

## 2012-03-14 DIAGNOSIS — C679 Malignant neoplasm of bladder, unspecified: Secondary | ICD-10-CM

## 2012-04-11 ENCOUNTER — Emergency Department (HOSPITAL_COMMUNITY): Payer: Medicare Other

## 2012-04-11 ENCOUNTER — Encounter (HOSPITAL_COMMUNITY): Payer: Self-pay | Admitting: Emergency Medicine

## 2012-04-11 ENCOUNTER — Ambulatory Visit (INDEPENDENT_AMBULATORY_CARE_PROVIDER_SITE_OTHER): Payer: Medicare Other | Admitting: Urology

## 2012-04-11 ENCOUNTER — Inpatient Hospital Stay (HOSPITAL_COMMUNITY)
Admission: EM | Admit: 2012-04-11 | Discharge: 2012-04-16 | DRG: 871 | Disposition: A | Discharge status: Home / Self Care | Payer: Medicare Other | Attending: Pulmonary Disease | Admitting: Pulmonary Disease

## 2012-04-11 DIAGNOSIS — I10 Essential (primary) hypertension: Secondary | ICD-10-CM | POA: Diagnosis present

## 2012-04-11 DIAGNOSIS — R079 Chest pain, unspecified: Secondary | ICD-10-CM

## 2012-04-11 DIAGNOSIS — E876 Hypokalemia: Secondary | ICD-10-CM | POA: Diagnosis present

## 2012-04-11 DIAGNOSIS — E872 Acidosis, unspecified: Secondary | ICD-10-CM | POA: Diagnosis present

## 2012-04-11 DIAGNOSIS — T380X5A Adverse effect of glucocorticoids and synthetic analogues, initial encounter: Secondary | ICD-10-CM | POA: Diagnosis present

## 2012-04-11 DIAGNOSIS — E139 Other specified diabetes mellitus without complications: Secondary | ICD-10-CM | POA: Diagnosis present

## 2012-04-11 DIAGNOSIS — T50A95A Adverse effect of other bacterial vaccines, initial encounter: Secondary | ICD-10-CM | POA: Diagnosis present

## 2012-04-11 DIAGNOSIS — K219 Gastro-esophageal reflux disease without esophagitis: Secondary | ICD-10-CM | POA: Diagnosis present

## 2012-04-11 DIAGNOSIS — I959 Hypotension, unspecified: Secondary | ICD-10-CM

## 2012-04-11 DIAGNOSIS — A419 Sepsis, unspecified organism: Principal | ICD-10-CM | POA: Diagnosis present

## 2012-04-11 DIAGNOSIS — N3090 Cystitis, unspecified without hematuria: Secondary | ICD-10-CM | POA: Diagnosis present

## 2012-04-11 DIAGNOSIS — J189 Pneumonia, unspecified organism: Secondary | ICD-10-CM | POA: Diagnosis present

## 2012-04-11 DIAGNOSIS — R739 Hyperglycemia, unspecified: Secondary | ICD-10-CM | POA: Diagnosis not present

## 2012-04-11 DIAGNOSIS — R6520 Severe sepsis without septic shock: Secondary | ICD-10-CM | POA: Diagnosis present

## 2012-04-11 DIAGNOSIS — C679 Malignant neoplasm of bladder, unspecified: Secondary | ICD-10-CM

## 2012-04-11 DIAGNOSIS — R509 Fever, unspecified: Secondary | ICD-10-CM | POA: Diagnosis present

## 2012-04-11 DIAGNOSIS — C676 Malignant neoplasm of ureteric orifice: Secondary | ICD-10-CM | POA: Diagnosis present

## 2012-04-11 DIAGNOSIS — N179 Acute kidney failure, unspecified: Secondary | ICD-10-CM | POA: Diagnosis present

## 2012-04-11 DIAGNOSIS — E785 Hyperlipidemia, unspecified: Secondary | ICD-10-CM | POA: Diagnosis present

## 2012-04-11 DIAGNOSIS — Z87891 Personal history of nicotine dependence: Secondary | ICD-10-CM

## 2012-04-11 DIAGNOSIS — C801 Malignant (primary) neoplasm, unspecified: Secondary | ICD-10-CM

## 2012-04-11 MED ORDER — SODIUM CHLORIDE 0.9 % IV SOLN
Freq: Once | INTRAVENOUS | Status: AC
  Administered 2012-04-11: via INTRAVENOUS

## 2012-04-11 MED ORDER — ACETAMINOPHEN 500 MG PO TABS
1000.0000 mg | ORAL_TABLET | Freq: Once | ORAL | Status: AC
  Administered 2012-04-11: 1000 mg via ORAL
  Filled 2012-04-11: qty 2

## 2012-04-11 NOTE — ED Notes (Signed)
Pt presents with SOB and hypotension. Initial BP 63/39. Pt also appears diaphoretic. Pt is oncology pt and had chemo today. Pt aax4 at this time

## 2012-04-11 NOTE — ED Notes (Signed)
Pt in bed, has cold chills at this time and is shivering slightly, asked for a blaket, gave him one but asked that he not cover his core with it due to fever. Lab in room at this time drawing blood.

## 2012-04-12 ENCOUNTER — Inpatient Hospital Stay (HOSPITAL_COMMUNITY): Payer: Medicare Other

## 2012-04-12 ENCOUNTER — Encounter (HOSPITAL_COMMUNITY): Payer: Self-pay | Admitting: *Deleted

## 2012-04-12 DIAGNOSIS — N179 Acute kidney failure, unspecified: Secondary | ICD-10-CM | POA: Diagnosis present

## 2012-04-12 DIAGNOSIS — I959 Hypotension, unspecified: Secondary | ICD-10-CM | POA: Diagnosis present

## 2012-04-12 DIAGNOSIS — R6521 Severe sepsis with septic shock: Secondary | ICD-10-CM

## 2012-04-12 DIAGNOSIS — T50A95A Adverse effect of other bacterial vaccines, initial encounter: Secondary | ICD-10-CM | POA: Diagnosis present

## 2012-04-12 DIAGNOSIS — J189 Pneumonia, unspecified organism: Secondary | ICD-10-CM

## 2012-04-12 DIAGNOSIS — A419 Sepsis, unspecified organism: Secondary | ICD-10-CM | POA: Diagnosis present

## 2012-04-12 DIAGNOSIS — R509 Fever, unspecified: Secondary | ICD-10-CM | POA: Diagnosis present

## 2012-04-12 LAB — COMPREHENSIVE METABOLIC PANEL
ALT: 41 U/L (ref 0–53)
AST: 31 U/L (ref 0–37)
Albumin: 2.4 g/dL — ABNORMAL LOW (ref 3.5–5.2)
CO2: 24 mEq/L (ref 19–32)
Calcium: 8.9 mg/dL (ref 8.4–10.5)
Chloride: 102 mEq/L (ref 96–112)
GFR calc non Af Amer: 37 mL/min — ABNORMAL LOW (ref >90)
Sodium: 137 mEq/L (ref 135–145)

## 2012-04-12 LAB — BASIC METABOLIC PANEL
BUN: 21 mg/dL (ref 6–23)
CO2: 16 mEq/L — ABNORMAL LOW (ref 19–32)
Calcium: 7.4 mg/dL — ABNORMAL LOW (ref 8.4–10.5)
Chloride: 106 mEq/L (ref 96–112)
Creatinine, Ser: 2.33 mg/dL — ABNORMAL HIGH (ref 0.50–1.35)
GFR calc Af Amer: 32 mL/min — ABNORMAL LOW (ref >90)
GFR calc non Af Amer: 27 mL/min — ABNORMAL LOW (ref >90)
Glucose, Bld: 335 mg/dL — ABNORMAL HIGH (ref 70–99)
Potassium: 5 mEq/L (ref 3.5–5.1)
Sodium: 135 mEq/L (ref 135–145)

## 2012-04-12 LAB — CBC WITH DIFFERENTIAL/PLATELET
Basophils Absolute: 0 10*3/uL (ref 0.0–0.1)
Basophils Relative: 0 % (ref 0–1)
Eosinophils Absolute: 0.1 10*3/uL (ref 0.0–0.7)
Eosinophils Relative: 1 % (ref 0–5)
MCH: 26.6 pg (ref 26.0–34.0)
MCHC: 31.1 g/dL (ref 30.0–36.0)
MCV: 85.5 fL (ref 78.0–100.0)
Neutrophils Relative %: 91 % — ABNORMAL HIGH (ref 43–77)
Platelets: 245 10*3/uL (ref 150–400)
RBC: 4.96 MIL/uL (ref 4.22–5.81)
RDW: 15.8 % — ABNORMAL HIGH (ref 11.5–15.5)

## 2012-04-12 LAB — HEPATIC FUNCTION PANEL
ALT: 32 U/L (ref 0–53)
Alkaline Phosphatase: 54 U/L (ref 39–117)
Bilirubin, Direct: 0.2 mg/dL (ref 0.0–0.3)
Indirect Bilirubin: 0.2 mg/dL — ABNORMAL LOW (ref 0.3–0.9)

## 2012-04-12 LAB — CREATININE, SERUM
Creatinine, Ser: 2.34 mg/dL — ABNORMAL HIGH (ref 0.50–1.35)
GFR calc Af Amer: 31 mL/min — ABNORMAL LOW (ref >90)
GFR calc non Af Amer: 27 mL/min — ABNORMAL LOW (ref >90)

## 2012-04-12 LAB — CARBOXYHEMOGLOBIN
Carboxyhemoglobin: 0.9 % (ref 0.5–1.5)
Methemoglobin: 1.5 % (ref 0.0–1.5)
O2 Saturation: 70.4 %
Total hemoglobin: 10.9 g/dL — ABNORMAL LOW (ref 13.5–18.0)

## 2012-04-12 LAB — URINALYSIS, ROUTINE W REFLEX MICROSCOPIC
Ketones, ur: NEGATIVE mg/dL
Protein, ur: >300 mg/dL — AB
Urobilinogen, UA: 1 mg/dL (ref 0.0–1.0)

## 2012-04-12 LAB — CBC
HCT: 35.1 % — ABNORMAL LOW (ref 39.0–52.0)
HCT: 35.4 % — ABNORMAL LOW (ref 39.0–52.0)
Hemoglobin: 10.6 g/dL — ABNORMAL LOW (ref 13.0–17.0)
Hemoglobin: 10.9 g/dL — ABNORMAL LOW (ref 13.0–17.0)
MCH: 25.8 pg — ABNORMAL LOW (ref 26.0–34.0)
MCH: 26 pg (ref 26.0–34.0)
MCHC: 30.2 g/dL (ref 30.0–36.0)
MCHC: 30.8 g/dL (ref 30.0–36.0)
MCV: 85.4 fL (ref 78.0–100.0)
MCV: 84.5 fL (ref 78.0–100.0)
Platelets: 177 10*3/uL (ref 150–400)
Platelets: 184 10*3/uL (ref 150–400)
RBC: 4.11 MIL/uL — ABNORMAL LOW (ref 4.22–5.81)
RBC: 4.19 MIL/uL — ABNORMAL LOW (ref 4.22–5.81)
RDW: 16.1 % — ABNORMAL HIGH (ref 11.5–15.5)
RDW: 16.2 % — ABNORMAL HIGH (ref 11.5–15.5)
WBC: 35.8 10*3/uL — ABNORMAL HIGH (ref 4.0–10.5)
WBC: 34.4 10*3/uL — ABNORMAL HIGH (ref 4.0–10.5)

## 2012-04-12 LAB — PROCALCITONIN
Procalcitonin: 7.48 ng/mL
Procalcitonin: 75.28 ng/mL

## 2012-04-12 LAB — LACTIC ACID, PLASMA
Lactic Acid, Venous: 5.1 mmol/L — ABNORMAL HIGH (ref 0.5–2.2)
Lactic Acid, Venous: 3.7 mmol/L — ABNORMAL HIGH (ref 0.5–2.2)

## 2012-04-12 LAB — MRSA PCR SCREENING: MRSA by PCR: NEGATIVE

## 2012-04-12 LAB — MAGNESIUM
Magnesium: 1 mg/dL — ABNORMAL LOW (ref 1.5–2.5)
Magnesium: 1.2 mg/dL — ABNORMAL LOW (ref 1.5–2.5)

## 2012-04-12 LAB — APTT: aPTT: 35 seconds (ref 24–37)

## 2012-04-12 LAB — URINE MICROSCOPIC-ADD ON

## 2012-04-12 LAB — PROTIME-INR
INR: 1.3 (ref 0.00–1.49)
Prothrombin Time: 16.4 seconds — ABNORMAL HIGH (ref 11.6–15.2)

## 2012-04-12 MED ORDER — PANTOPRAZOLE SODIUM 40 MG PO TBEC
40.0000 mg | DELAYED_RELEASE_TABLET | Freq: Every day | ORAL | Status: DC
  Administered 2012-04-12 – 2012-04-15 (×4): 40 mg via ORAL
  Filled 2012-04-12 (×4): qty 1

## 2012-04-12 MED ORDER — BIOTENE DRY MOUTH MT LIQD
15.0000 mL | Freq: Two times a day (BID) | OROMUCOSAL | Status: DC
  Administered 2012-04-12 – 2012-04-16 (×8): 15 mL via OROMUCOSAL

## 2012-04-12 MED ORDER — NOREPINEPHRINE BITARTRATE 1 MG/ML IJ SOLN
2.0000 ug/min | INTRAVENOUS | Status: DC | PRN
  Filled 2012-04-12: qty 4

## 2012-04-12 MED ORDER — IBUPROFEN 800 MG PO TABS
800.0000 mg | ORAL_TABLET | Freq: Once | ORAL | Status: AC
  Administered 2012-04-12: 800 mg via ORAL
  Filled 2012-04-12: qty 1

## 2012-04-12 MED ORDER — SODIUM CHLORIDE 0.9 % IV SOLN: 250.0000 mL | INTRAVENOUS | Status: DC | PRN

## 2012-04-12 MED ORDER — SODIUM CHLORIDE 0.9 % IV SOLN
1000.0000 mL | Freq: Once | INTRAVENOUS | Status: AC
  Administered 2012-04-12: 1000 mL via INTRAVENOUS

## 2012-04-12 MED ORDER — HYDROCORTISONE SOD SUCCINATE 100 MG IJ SOLR
50.0000 mg | Freq: Four times a day (QID) | INTRAMUSCULAR | Status: DC
  Administered 2012-04-12 – 2012-04-13 (×7): 50 mg via INTRAVENOUS
  Filled 2012-04-12 (×9): qty 1

## 2012-04-12 MED ORDER — SODIUM CHLORIDE 0.9 % IJ SOLN
3.0000 mL | Freq: Two times a day (BID) | INTRAMUSCULAR | Status: DC
  Administered 2012-04-12 – 2012-04-15 (×7): 3 mL via INTRAVENOUS

## 2012-04-12 MED ORDER — ACETAMINOPHEN 325 MG PO TABS: 650.0000 mg | ORAL_TABLET | Freq: Four times a day (QID) | ORAL | Status: DC | PRN

## 2012-04-12 MED ORDER — ISONIAZID 300 MG PO TABS
300.0000 mg | ORAL_TABLET | Freq: Every day | ORAL | Status: AC
  Administered 2012-04-12 – 2012-04-14 (×3): 300 mg via ORAL
  Filled 2012-04-12 (×3): qty 1

## 2012-04-12 MED ORDER — ONDANSETRON HCL 4 MG/2ML IJ SOLN: 4.0000 mg | Freq: Four times a day (QID) | INTRAMUSCULAR | Status: DC | PRN

## 2012-04-12 MED ORDER — NOREPINEPHRINE BITARTRATE 1 MG/ML IJ SOLN
2.0000 ug/min | INTRAVENOUS | Status: DC
  Administered 2012-04-12: 2 ug/min via INTRAVENOUS
  Filled 2012-04-12 (×2): qty 4

## 2012-04-12 MED ORDER — SODIUM CHLORIDE 0.9 % IV BOLUS (SEPSIS): 500.0000 mL | Freq: Once | INTRAVENOUS | Status: DC

## 2012-04-12 MED ORDER — NOREPINEPHRINE BITARTRATE 1 MG/ML IJ SOLN
INTRAMUSCULAR | Status: AC
  Filled 2012-04-12: qty 4

## 2012-04-12 MED ORDER — SODIUM CHLORIDE 0.9 % IV BOLUS (SEPSIS)
1000.0000 mL | Freq: Once | INTRAVENOUS | Status: AC
  Administered 2012-04-12: 1000 mL via INTRAVENOUS

## 2012-04-12 MED ORDER — DOBUTAMINE IN D5W 4-5 MG/ML-% IV SOLN
2.5000 ug/kg/min | INTRAVENOUS | Status: DC | PRN
  Filled 2012-04-12: qty 250

## 2012-04-12 MED ORDER — SODIUM BICARBONATE 8.4 % IV SOLN
INTRAVENOUS | Status: DC
  Administered 2012-04-12: 18:00:00 via INTRAVENOUS
  Filled 2012-04-12 (×3): qty 50

## 2012-04-12 MED ORDER — HEPARIN SODIUM (PORCINE) 5000 UNIT/ML IJ SOLN
5000.0000 [IU] | Freq: Three times a day (TID) | INTRAMUSCULAR | Status: DC
  Administered 2012-04-12 – 2012-04-15 (×10): 5000 [IU] via SUBCUTANEOUS
  Filled 2012-04-12 (×13): qty 1

## 2012-04-12 MED ORDER — LIDOCAINE HCL (CARDIAC) 20 MG/ML IV SOLN
INTRAVENOUS | Status: AC
  Administered 2012-04-12: 02:00:00
  Filled 2012-04-12: qty 5

## 2012-04-12 MED ORDER — PIPERACILLIN-TAZOBACTAM IN DEX 2-0.25 GM/50ML IV SOLN
2.2500 g | Freq: Four times a day (QID) | INTRAVENOUS | Status: DC
Start: 2012-04-12 — End: 2012-04-12
  Administered 2012-04-12: 2.25 g via INTRAVENOUS
  Filled 2012-04-12 (×3): qty 50

## 2012-04-12 MED ORDER — TAMSULOSIN HCL 0.4 MG PO CAPS
0.4000 mg | ORAL_CAPSULE | Freq: Every evening | ORAL | Status: DC
  Administered 2012-04-12 – 2012-04-15 (×4): 0.4 mg via ORAL
  Filled 2012-04-12 (×5): qty 1

## 2012-04-12 MED ORDER — PANTOPRAZOLE SODIUM 40 MG IV SOLR
40.0000 mg | Freq: Every day | INTRAVENOUS | Status: DC
  Filled 2012-04-12: qty 40

## 2012-04-12 MED ORDER — ACETAMINOPHEN 650 MG RE SUPP: 650.0000 mg | Freq: Four times a day (QID) | RECTAL | Status: DC | PRN

## 2012-04-12 MED ORDER — SODIUM CHLORIDE 0.9 % IV SOLN
INTRAVENOUS | Status: DC
  Administered 2012-04-12 – 2012-04-14 (×3): via INTRAVENOUS

## 2012-04-12 MED ORDER — ENOXAPARIN SODIUM 40 MG/0.4ML ~~LOC~~ SOLN: 40.0000 mg | SUBCUTANEOUS | Status: DC

## 2012-04-12 MED ORDER — ONDANSETRON HCL 4 MG PO TABS: 4.0000 mg | ORAL_TABLET | Freq: Four times a day (QID) | ORAL | Status: DC | PRN

## 2012-04-12 MED ORDER — VANCOMYCIN HCL 1000 MG IV SOLR
750.0000 mg | INTRAVENOUS | Status: DC
  Administered 2012-04-12 – 2012-04-13 (×2): 750 mg via INTRAVENOUS
  Filled 2012-04-12 (×3): qty 750

## 2012-04-12 MED ORDER — VITAMIN B-6 50 MG PO TABS
50.0000 mg | ORAL_TABLET | Freq: Every day | ORAL | Status: AC
  Administered 2012-04-12 – 2012-04-14 (×3): 50 mg via ORAL
  Filled 2012-04-12 (×3): qty 1

## 2012-04-12 MED ORDER — VANCOMYCIN HCL IN DEXTROSE 1-5 GM/200ML-% IV SOLN
1000.0000 mg | Freq: Once | INTRAVENOUS | Status: AC
  Administered 2012-04-12: 1000 mg via INTRAVENOUS
  Filled 2012-04-12: qty 200

## 2012-04-12 MED ORDER — VASOPRESSIN 20 UNIT/ML IJ SOLN
0.0300 [IU]/min | INTRAVENOUS | Status: DC
  Administered 2012-04-12: 0.03 [IU]/min via INTRAVENOUS
  Filled 2012-04-12: qty 2.5

## 2012-04-12 MED ORDER — MAGNESIUM SULFATE IN D5W 10-5 MG/ML-% IV SOLN
1.0000 g | Freq: Once | INTRAVENOUS | Status: AC
  Administered 2012-04-12: 1 g via INTRAVENOUS
  Filled 2012-04-12: qty 100

## 2012-04-12 MED ORDER — PIPERACILLIN-TAZOBACTAM 3.375 G IVPB
3.3750 g | Freq: Three times a day (TID) | INTRAVENOUS | Status: DC
  Administered 2012-04-12 – 2012-04-16 (×12): 3.375 g via INTRAVENOUS
  Filled 2012-04-12 (×14): qty 50

## 2012-04-12 MED ORDER — SODIUM CHLORIDE 0.9 % IV BOLUS (SEPSIS): 500.0000 mL | INTRAVENOUS | Status: DC | PRN

## 2012-04-12 MED ORDER — FINASTERIDE 5 MG PO TABS
5.0000 mg | ORAL_TABLET | Freq: Every morning | ORAL | Status: DC
  Administered 2012-04-12 – 2012-04-16 (×5): 5 mg via ORAL
  Filled 2012-04-12 (×5): qty 1

## 2012-04-12 MED ORDER — DOCUSATE SODIUM 100 MG PO CAPS
100.0000 mg | ORAL_CAPSULE | Freq: Two times a day (BID) | ORAL | Status: DC
  Administered 2012-04-12: 100 mg via ORAL
  Filled 2012-04-12 (×9): qty 1

## 2012-04-12 MED ORDER — PIPERACILLIN-TAZOBACTAM 3.375 G IVPB
3.3750 g | Freq: Once | INTRAVENOUS | Status: AC
  Administered 2012-04-12: 3.375 g via INTRAVENOUS
  Filled 2012-04-12: qty 50

## 2012-04-12 MED ORDER — LEVOFLOXACIN IN D5W 750 MG/150ML IV SOLN
750.0000 mg | INTRAVENOUS | Status: DC
  Administered 2012-04-12: 750 mg via INTRAVENOUS
  Filled 2012-04-12: qty 150

## 2012-04-12 MED ORDER — NOREPINEPHRINE BITARTRATE 1 MG/ML IJ SOLN
2.0000 ug/min | INTRAVENOUS | Status: DC
  Administered 2012-04-12: 25 ug/min via INTRAVENOUS
  Administered 2012-04-12: 8 ug/min via INTRAVENOUS
  Administered 2012-04-12: 10 ug/min via INTRAVENOUS
  Administered 2012-04-13: 5.333 ug/min
  Filled 2012-04-12 (×2): qty 16

## 2012-04-12 NOTE — ED Notes (Signed)
Notified dr strand of BP, Was advised to give 1 liter NS bolus

## 2012-04-12 NOTE — ED Notes (Signed)
In and out cath performed using sterile technique. 300cc urine obtained, patient tolerated well.

## 2012-04-12 NOTE — ED Notes (Signed)
Dr Phillips Odor finished inserting central line, pt tolerated procedure very well. Radiology in room at this time for placement verification.

## 2012-04-12 NOTE — ED Notes (Signed)
BP 67/48, Dr Phillips Odor notified and advised to open up fluids and to start a 2nd large bore IV

## 2012-04-12 NOTE — Progress Notes (Signed)
Hypomagnesemia;   Magnesium replaced.  

## 2012-04-12 NOTE — H&P (Signed)
Name: Frank Delacruz MRN: [id number] DOB: 03/28/1946    LOS: 1  Referring Provider:  Anderson Malta Reason for Referral:  Septic shock  PULMONARY / CRITICAL CARE MEDICINE  HPI:  Frank Delacruz is a 66 year old man with history of bladder carcinoma, who is being treated with BCG installation.  He underwent BCG installation.  The afternoon of August 30.  A few hours after this procedure he developed a high fever and chills.  She went to the emergency room and was found to have a temperature 103, and low blood pressure.  He was admitted to the hospital for sepsis over when his blood pressure did not improve with fluids he was transferred to the intensive care unit  Past Medical History  Diagnosis Date  . Hyperlipidemia, mild   . Increased prostate specific antigen (PSA) velocity   . Hypertension   . Urothelial carcinoma of bladder  . H/O hiatal hernia   . GERD (gastroesophageal reflux disease) occasionlly takes zantac  . Arthritis of shoulder region, left     lt. shoulder  . History of kidney stones   . Frequency of urination   . Urgency of urination   . Nocturia   . Hematuria    Past Surgical History  Procedure Date  . Anterior cervical decomp/discectomy fusion 03-09-2004    C5 - 6/ plates and screws retained  . Echo/ cardiology stress test 04-22-2009  DR [facility]    NORMAL  . Ureterolithotomy MARCH 2012-- [facility]  . Cysto/  resection bladder lesion with bx JULY 2012-- [facility]  . Rotator cuff repair 1997    LEFT  . Inguinal hernia repair 2006    RIGHT  . Cystoscopy w/ retrogrades 11/21/2011    Procedure: CYSTOSCOPY WITH RETROGRADE PYELOGRAM;  Surgeon: Marcine Matar, MD;  Location: [facility];  Service: Urology;  Laterality: Bilateral;  BLADDER BX  1 hour requested for this case    . Ureteral reimplantion 01/14/2012    Procedure: URETERAL REIMPLANT;  Surgeon: Marcine Matar, MD;  Location: [facility];  Service: Urology;  Laterality: Right;  RIGHT SEGMENTAL  URETERECTOMY WITH RIGHT URETERAL REIMPLANT    . Ureterectomy 01/14/2012    Procedure: URETERECTOMY;  Surgeon: Marcine Matar, MD;  Location: [facility];  Service: Urology;  Laterality: Right;   Prior to Admission medications   Medication Sig Start Date End Date Taking? Authorizing Provider  atenolol (TENORMIN) 50 MG tablet Take 50 mg by mouth every morning.     Historical Provider, MD  finasteride (PROSCAR) 5 MG tablet Take 5 mg by mouth every morning.    Historical Provider, MD  fish oil-omega-3 fatty acids 1000 MG capsule Take 2 g by mouth daily.    Historical Provider, MD  Ranitidine HCl (ZANTAC PO) Take 75 mg by mouth daily as needed. For acid reflux     Historical Provider, MD  Tamsulosin HCl (FLOMAX) 0.4 MG CAPS Take 0.4 mg by mouth every evening.    Historical Provider, MD   Allergies Allergies  Allergen Reactions  . Meloxicam Nausea And Vomiting  . Naproxen Nausea And Vomiting    Family History Family History  Problem Relation Age of Onset  . Stroke Mother   . Cancer Father    Social History  reports that he quit smoking about 23 years ago. His smoking use included Cigarettes. His smokeless tobacco use includes Chew. He reports that he does not drink alcohol or use illicit drugs.  Review Of Systems:  10 point review of systems negative except  as stated in the history of present illness  Brief patient description:  66 year-old male with septic shock after BCG installation  Events Since Admission: Transfer ICU  Current Status: Guarded  Vital Signs: Temp:  [100.3 F (37.9 C)-103.2 F (39.6 C)] 100.3 F (37.9 C) (08/31 0340) Pulse Rate:  [106-124] 106  (08/31 0340) Resp:  [24-26] 26  (08/31 0340) BP: (67-113)/(44-84) 90/56 mmHg (08/31 0340) SpO2:  [95 %-99 %] 98 % (08/31 0340) Weight:  [77.111 kg (170 lb)] 77.111 kg (170 lb) (08/30 2321)  Physical Examination: General:  Well-appearing, well-developed male in no apparent distress Neuro:  Alert and oriented x3.   Cranial nerves II through XII intact HEENT:  PERRLA, EOMI Neck:  Supple Cardiovascular:  Normal rate regular rhythm, 2/6 systolic murmur over the left precordium Lungs:  Clear to auscultation bilaterally.  No wheezes or crackles Abdomen:  Soft, mild tenderness palpation, 2+ bowel sounds, no appreciated hepatosplenomegaly Musculoskeletal:   No joint abnormalities, no clubbing, cyanosis, or edema, peripheral pulses 2+ Skin:  No rash  Principal Problem:  *Sepsis Active Problems:  Cystitis due to intravesical BCG administration  Hypotension  PNA (pneumonia)  Fever  Acute renal failure   ASSESSMENT AND PLAN  PULMONARY No results found for this basename: PHART:5,PCO2:5,PCO2ART:5,PO2ART:5,HCO3:5,O2SAT:5 in the last 168 hours Ventilator Settings:   CXR:  Some cephalization.  Possible left lower lobe infiltrate. ETT:  Patient is not intubated  A:  Currently on nasal cannula P:   - Monitor oxygen saturation - Questionable left lower lobe pneumonia versus atelectasis on broad-spectrum antibiotics for sepsis  CARDIOVASCULAR  Lab 04/11/12 2336  TROPONINI --  LATICACIDVEN 5.1*  PROBNP --   ECG:  Sinus tachycardia Lines: A right internal jugular triple lumen venous catheter  A: Hypotension, associated with septic shock P:  - Bolus fluids to CVP greater than 8-10 - Titrate norepinephrine to maps greater than 65 - Add vasopressin if requiring high doses of norepinephrine. - Add stress dose steroids for refractory shock and possible disseminated BCG RENAL  Lab 04/11/12 2336  NA 137  K 4.0  CL 102  CO2 24  BUN 14  CREATININE 1.82*  CALCIUM 8.9  MG --  PHOS --   Intake/Output    None      A:  Elevated creatinine P:   - Monitor urine output - Monitor daily creatinine.p.o. Except medications  GASTROINTESTINAL  Lab 04/11/12 2336  AST 31  ALT 41  ALKPHOS 79  BILITOT 0.4  PROT 6.7  ALBUMIN 2.4*    A:  At risk for ischemia with severe hypotension P:   -  N.p.o. except medications   HEMATOLOGIC  Lab 04/11/12 2336  HGB 13.2  HCT 42.4  PLT 245  INR --  APTT --   A:  no current problems P:  Monitor hemoglobin and hematocrit daily Monitor platelets while on heparin Heparin for DVT prophylaxis  INFECTIOUS  Lab 04/11/12 2336  WBC 18.6*  PROCALCITON 7.48   Cultures:  blood, urine, AFB  Antibiotics: vancomycin, Zosyn, isoniazid   A:  Concern for either disseminated BCG after BCG installation for bladder carcinoma or septic shock from UTI or other source P:   Start vancomycin and Zosyn  Start isoniazid for possible disseminated BCG   ENDOCRINE No results found for this basename: GLUCAP:5 in the last 168 hours A:   at risk for hyperglycemia, and adrenal insufficiency   P:   Started on stress dose steroids for refractory shock, as well as treatment for  possible disseminated BCG  monitor, blood glucose  NEUROLOGIC  A:   no current problems P:   Monitor mental status   BEST PRACTICE / DISPOSITION Level of Care:   ICU  Primary Service:   PC CM  Consultants:   none  Code Status:   full  Diet:  N.p.o.  DVT Px:   heparin, and SCDs  GI Px:  Not indicated  Skin Integrity:   good  Social / Family:   family at bedside   I spent 30 minutes of direct critical care with this patient.  Carolan Clines., M.D. Pulmonary and Critical Care Medicine [facility] Pager: [phone]  04/12/2012, 5:47 AM

## 2012-04-12 NOTE — ED Notes (Signed)
Dr Phillips Odor in room inserting a central line. Pt is tolerating well at this time. [facility] has been contacted and are on the way.

## 2012-04-12 NOTE — Progress Notes (Signed)
ANTIBIOTIC CONSULT NOTE - INITIAL  Pharmacy Consult for vancomycin Indication: UTI/sepsis/?PNA  Allergies  Allergen Reactions  . Meloxicam Nausea And Vomiting  . Naproxen Nausea And Vomiting    Patient Measurements: Height: 5\' 6"  (167.6 cm) Weight: 170 lb (77.111 kg) IBW/kg (Calculated) : 63.8   Vital Signs: Temp: 100.3 F (37.9 C) (08/31 0340) Temp src: Oral (08/31 0340) BP: 90/56 mmHg (08/31 0340) Pulse Rate: 106  (08/31 0340)  Labs:  Basename 04/11/12 2336  WBC 18.6*  HGB 13.2  PLT 245  LABCREA --  CREATININE 1.82*   Estimated Creatinine Clearance: 38.5 ml/min (by C-G formula based on Cr of 1.82).  Microbiology: Recent Results (from the past 720 hour(s))  CULTURE, BLOOD (ROUTINE X 2)     Status: Normal (Preliminary result)   Collection Time   04/11/12 11:36 PM      Component Value Range Status Comment   Specimen Description RIGHT ANTECUBITAL   Final    Special Requests BOTTLES DRAWN AEROBIC AND ANAEROBIC 6CC   Final    Culture PENDING   Incomplete    Report Status PENDING   Incomplete   CULTURE, BLOOD (ROUTINE X 2)     Status: Normal (Preliminary result)   Collection Time   04/11/12 11:43 PM      Component Value Range Status Comment   Specimen Description BLOOD RIGHT HAND   Final    Special Requests BOTTLES DRAWN AEROBIC AND ANAEROBIC 6CC   Final    Culture PENDING   Incomplete    Report Status PENDING   Incomplete     Medical History: Past Medical History  Diagnosis Date  . Hyperlipidemia, mild   . Increased prostate specific antigen (PSA) velocity   . Hypertension   . Urothelial carcinoma of bladder  . H/O hiatal hernia   . GERD (gastroesophageal reflux disease) occasionlly takes zantac  . Arthritis of shoulder region, left     lt. shoulder  . History of kidney stones   . Frequency of urination   . Urgency of urination   . Nocturia   . Hematuria     Medications:  Prescriptions prior to admission  Medication Sig Dispense Refill  . atenolol  (TENORMIN) 50 MG tablet Take 50 mg by mouth every morning.       . finasteride (PROSCAR) 5 MG tablet Take 5 mg by mouth every morning.      . fish oil-omega-3 fatty acids 1000 MG capsule Take 2 g by mouth daily.      . Ranitidine HCl (ZANTAC PO) Take 75 mg by mouth daily as needed. For acid reflux       . Tamsulosin HCl (FLOMAX) 0.4 MG CAPS Take 0.4 mg by mouth every evening.       Scheduled:    . sodium chloride   Intravenous Once  . sodium chloride  1,000 mL Intravenous Once  . acetaminophen  1,000 mg Oral Once  . docusate sodium  100 mg Oral BID  . finasteride  5 mg Oral q morning - 10a  . heparin  5,000 Units Subcutaneous Q8H  . ibuprofen  800 mg Oral Once  . lidocaine (cardiac) 100 mg/46ml      . pantoprazole (PROTONIX) IV  40 mg Intravenous QHS  . piperacillin-tazobactam (ZOSYN)  IV  2.25 g Intravenous Q6H  . piperacillin-tazobactam  3.375 g Intravenous Once  . sodium chloride  1,000 mL Intravenous Once  . sodium chloride  3 mL Intravenous Q12H  . Tamsulosin HCl  0.4  mg Oral QPM  . vancomycin  1,000 mg Intravenous Once  . DISCONTD: enoxaparin (LOVENOX) injection  40 mg Subcutaneous Q24H  . DISCONTD: levofloxacin (LEVAQUIN) IV  750 mg Intravenous Q24H    Assessment: 66yo male with h/o ureter resection and recent BCG bladder instillation, found hypotensive with possible pneumonitis/PNA in LLL as well as elevated WBC/lactic acid, to begin IV ABX for sepsis; of note, last available labs from June 2013 show SCr <0.8, now 1.82.  Goal of Therapy:  Vancomycin trough level 15-20 mcg/ml  Plan:  Rec'd vanc 1g in ED; will continue with vancomycin 1000mg  IV Q24H and monitor CBC, Cx, CrCl, levels prn.  Colleen Can PharmD BCPS 04/12/2012,5:21 AM

## 2012-04-12 NOTE — ED Notes (Signed)
Dr Phillips Odor and [facility] state to continue running levophed through peripheral IV (18g R AC) while en route to [facility] [place]. Placement verification has not yet been received from radiology.

## 2012-04-12 NOTE — ED Provider Notes (Addendum)
History     CSN: [id number]  Arrival date & time 04/11/12  2307   First MD Initiated Contact with Patient 04/11/12 2314      Chief Complaint  Patient presents with  . Shortness of Breath  . Hypotension    (Consider location/radiation/quality/duration/timing/severity/associated sxs/prior treatment) HPI  Frank Delacruz is a 66 y.o. male with a h/o ureteral tumor s/p resection who had his 4th chemotherapy treatment last week  brought in by ambulance, who presents to the Emergency Department complaining of fever and shortness of breath that developed this evening.Patient is receiving BCG FU in Dr. Jannet Mantis office. He received two treatments and developed a staph infection forcing them to hold chemo therapy until the infection cleared.   PCP Dr. Lysbeth Galas Urologist Dr. Letha Cape  Past Medical History  Diagnosis Date  . Hyperlipidemia, mild   . Increased prostate specific antigen (PSA) velocity   . Hypertension   . Urothelial carcinoma of bladder  . H/O hiatal hernia   . GERD (gastroesophageal reflux disease) occasionlly takes zantac  . Arthritis of shoulder region, left     lt. shoulder  . History of kidney stones   . Frequency of urination   . Urgency of urination   . Nocturia   . Hematuria     Past Surgical History  Procedure Date  . Anterior cervical decomp/discectomy fusion 03-09-2004    C5 - 6/ plates and screws retained  . Echo/ cardiology stress test 04-22-2009  DR [facility]    NORMAL  . Ureterolithotomy MARCH 2012-- [facility]  . Cysto/  resection bladder lesion with bx JULY 2012-- [facility]  . Rotator cuff repair 1997    LEFT  . Inguinal hernia repair 2006    RIGHT  . Cystoscopy w/ retrogrades 11/21/2011    Procedure: CYSTOSCOPY WITH RETROGRADE PYELOGRAM;  Surgeon: Marcine Matar, MD;  Location: [facility];  Service: Urology;  Laterality: Bilateral;  BLADDER BX  1 hour requested for this case    . Ureteral reimplantion 01/14/2012    Procedure: URETERAL  REIMPLANT;  Surgeon: Marcine Matar, MD;  Location: [facility];  Service: Urology;  Laterality: Right;  RIGHT SEGMENTAL URETERECTOMY WITH RIGHT URETERAL REIMPLANT    . Ureterectomy 01/14/2012    Procedure: URETERECTOMY;  Surgeon: Marcine Matar, MD;  Location: [facility];  Service: Urology;  Laterality: Right;    Family History  Problem Relation Age of Onset  . Stroke Mother   . Cancer Father     History  Substance Use Topics  . Smoking status: Former Smoker    Types: Cigarettes    Quit date: 01/08/1989  . Smokeless tobacco: Current User    Types: Chew   Comment: CHEWED TOBACCO FOR APPROX. 50 YRS - STATES HAS CUT-DOWN SINCE MARCH 2012  . Alcohol Use: No      Review of Systems  Constitutional: Positive for fever, chills and diaphoresis.       10 Systems reviewed and are negative for acute change except as noted in the HPI.  HENT: Negative for congestion.   Eyes: Negative for discharge and redness.  Respiratory: Negative for cough and shortness of breath.   Cardiovascular: Negative for chest pain.  Gastrointestinal: Negative for vomiting and abdominal pain.  Musculoskeletal: Negative for back pain.  Skin: Negative for rash.  Neurological: Negative for syncope, numbness and headaches.  Psychiatric/Behavioral:       No behavior change.    Allergies  Meloxicam and Naproxen  Home Medications   Current Outpatient Rx  Name Route Sig Dispense Refill  . ATENOLOL 50 MG PO TABS Oral Take 50 mg by mouth every morning.     Marland Kitchen FINASTERIDE 5 MG PO TABS Oral Take 5 mg by mouth every morning.    Marland Kitchen OMEGA-3 FATTY ACIDS 1000 MG PO CAPS Oral Take 2 g by mouth daily.    Marland Kitchen ZANTAC PO Oral Take 75 mg by mouth daily as needed. For acid reflux     . TAMSULOSIN HCL 0.4 MG PO CAPS Oral Take 0.4 mg by mouth every evening.      BP 113/84  Pulse 124  Temp 102.1 F (38.9 C) (Oral)  Resp 26  Ht 5\' 6"  (1.676 m)  Wt 170 lb (77.111 kg)  BMI 27.44 kg/m2  SpO2 95%  Physical Exam  Nursing note  and vitals reviewed. Constitutional: He is oriented to person, place, and time. He appears well-developed and well-nourished.       Awake, alert, nontoxic appearance.  HENT:  Head: Atraumatic.  Eyes: Right eye exhibits no discharge. Left eye exhibits no discharge.  Neck: Neck supple.  Cardiovascular:       tachycardia  Pulmonary/Chest: Effort normal. No respiratory distress. He has no wheezes. He exhibits no tenderness.  Abdominal: Soft. There is no tenderness. There is no rebound.  Musculoskeletal: He exhibits no tenderness.       Baseline ROM, no obvious new focal weakness.  Neurological: He is alert and oriented to person, place, and time.       Mental status and motor strength appears baseline for patient and situation.  Skin: No rash noted.  Psychiatric: He has a normal mood and affect.    ED Course  Procedures (including critical care time) Results for orders placed during the hospital encounter of 04/11/12  COMPREHENSIVE METABOLIC PANEL      Component Value Range   Sodium 137  135 - 145 mEq/L   Potassium 4.0  3.5 - 5.1 mEq/L   Chloride 102  96 - 112 mEq/L   CO2 24  19 - 32 mEq/L   Glucose, Bld 164 (*) 70 - 99 mg/dL   BUN 14  6 - 23 mg/dL   Creatinine, Ser 1.61 (*) 0.50 - 1.35 mg/dL   Calcium 8.9  8.4 - 09.6 mg/dL   Total Protein 6.7  6.0 - 8.3 g/dL   Albumin 2.4 (*) 3.5 - 5.2 g/dL   AST 31  0 - 37 U/L   ALT 41  0 - 53 U/L   Alkaline Phosphatase 79  39 - 117 U/L   Total Bilirubin 0.4  0.3 - 1.2 mg/dL   GFR calc non Af Amer 37 (*) >90 mL/min   GFR calc Af Amer 43 (*) >90 mL/min  CULTURE, BLOOD (ROUTINE X 2)      Component Value Range   Specimen Description RIGHT ANTECUBITAL     Special Requests BOTTLES DRAWN AEROBIC AND ANAEROBIC 6CC     Culture PENDING     Report Status PENDING    CULTURE, BLOOD (ROUTINE X 2)      Component Value Range   Specimen Description BLOOD RIGHT HAND     Special Requests BOTTLES DRAWN AEROBIC AND ANAEROBIC 6CC     Culture PENDING      Report Status PENDING    CBC WITH DIFFERENTIAL      Component Value Range   WBC 18.6 (*) 4.0 - 10.5 K/uL   RBC 4.96  4.22 - 5.81 MIL/uL   Hemoglobin 13.2  13.0 - 17.0 g/dL   HCT 16.1  09.6 - 04.5 %   MCV 85.5  78.0 - 100.0 fL   MCH 26.6  26.0 - 34.0 pg   MCHC 31.1  30.0 - 36.0 g/dL   RDW 40.9 (*) 81.1 - 91.4 %   Platelets 245  150 - 400 K/uL   Neutrophils Relative 91 (*) 43 - 77 %   Neutro Abs 16.8 (*) 1.7 - 7.7 K/uL   Lymphocytes Relative 5 (*) 12 - 46 %   Lymphs Abs 0.9  0.7 - 4.0 K/uL   Monocytes Relative 4  3 - 12 %   Monocytes Absolute 0.7  0.1 - 1.0 K/uL   Eosinophils Relative 1  0 - 5 %   Eosinophils Absolute 0.1  0.0 - 0.7 K/uL   Basophils Relative 0  0 - 1 %   Basophils Absolute 0.0  0.0 - 0.1 K/uL  LACTIC ACID, PLASMA      Component Value Range   Lactic Acid, Venous 5.1 (*) 0.5 - 2.2 mmol/L    Dg Chest Portable 1 View  04/12/2012  *RADIOLOGY REPORT*  Clinical Data: Shortness of breath and hypertension  PORTABLE CHEST - 1 VIEW  Comparison: 03/06/2004  Findings: The lung volumes are low.  No pleural effusion or edema.  Heart size is normal.  Asymmetric opacity in the left base may represent infiltrate and/or atelectasis.  IMPRESSION:  1.  Low lung volumes with the left base opacity.  Cannot rule out pneumonia.   Original Report Authenticated By: Rosealee Albee, M.D.    . Date: 04/11/2012  2307  Rate: 133  Rhythm: sinus tachycardia  QRS Axis: normal  Intervals: normal  ST/T Wave abnormalities: normal  Conduction Disutrbances: none  Narrative Interpretation: unremarkable     No diagnosis found.  12:54 AM:  T/C to Dr. Phillips Odor, hospitalist case discussed, including:  HPI, pertinent PM/SHx, VS/PE, dx testing, ED course and treatment.  Agreeable to admission.  She will see the patient in the ER.  MDM  Patient with ureteral tumor s/p resection on chemotherapy who has developed fever, chills, and a cough. Fever to 103, labs with wbc 18, chest xray with left base pna.  Initiated fluid resuscitation, antibiotics. Cultures pending. Spoke with Dr. Phillips Odor, hospitalist who will see/admit the patient.Blood pressure has improved with fluids, temperature improving with tylenol and ibuprofen. Pt stable in ED with no significant deterioration in condition.The patient appears reasonably stabilized for admission considering the current resources, flow, and capabilities available in the ED at this time, and I doubt any other [facility] requiring further screening and/or treatment in the ED prior to admission.  MDM Reviewed: nursing note, previous chart and vitals Interpretation: labs, ECG and x-ray    CRITICAL CARE Performed by: Annamarie Dawley. Total critical care time: 35 Critical care time was exclusive of separately billable procedures and treating other patients. Critical care was necessary to treat or prevent imminent or life-threatening deterioration. Critical care was time spent personally by me on the following activities: development of treatment plan with patient and/or surrogate as well as nursing, discussions with consultants, evaluation of patient's response to treatment, examination of patient, obtaining history from patient or surrogate, ordering and performing treatments and interventions, ordering and review of laboratory studies, ordering and review of radiographic studies, pulse oximetry and re-evaluation of patient's condition.       Nicoletta Dress. Colon Branch, MD 04/12/12 0100  Nicoletta Dress. Colon Branch, MD 04/12/12 [phone]

## 2012-04-12 NOTE — Progress Notes (Signed)
Patient continues to be hypotensive despite 2+liters of IV fluid, tachycardic and febrile. Need Sepsis protocol and pressors at this point. I spoke with Dr. Kendrick Fries [facility]-link CCM physician who will accept patient in transfer to [facility] for ICU/CCM management. He has two large bore IV access points in his ACs-I will attempt IJ central line placement, will need A-Line placed at [facility], as A-Line equipment not available in ED at this time.

## 2012-04-12 NOTE — Progress Notes (Signed)
Metabolic acidosis with gap most likely related to acute renal failure. Lactic acid pending.   Initiated bicarb drip.

## 2012-04-12 NOTE — Procedures (Addendum)
Procedure Note Right IJ placement under US Guidance  Patient with MAP <50, Needed TLC access for pressors, CVP monitoring and IV abx  Full consent obtained from patient. Risk benefits discussed.  The area of the Right IJ was prepped with CHG, patient was draped in sterile fashion, using ultrasound guidance the right IJ was located and cannulated using a standard TLC kit, the guidewire passed without complication, the insertion was dilated and the catheter was passed, guidewire was removed without complication, sutured in at hub and tegaderm drg placed. XR called to confirm placement. [facility] arrived to transport to [facility].  Patient without complaints, minimal bleeding.  Awaiting verifcation of proper line placement prior to use.  60 minutes spent in obtaining consents, explaining and performing procedure including verification of line placement.  Frank Petrin, DO Internal Medicine [facility]

## 2012-04-12 NOTE — H&P (Addendum)
[facility] History and Physical  Frank Delacruz WUJ:[id number] DOB: 1946/07/07 DOA: 04/11/2012  Referring physician: Eloise Levels PCP: Josue Hector, MD   Chief Complaint: Fever, Weakness  HPI: Frank Delacruz is a 66 y.o. male with bladder and ureteral carcinoma who is s/p ureter resection and has been getting treatment with intravesicular BCG by Dr. Retta Diones. His last treatment was the afternoon prior to admission, he developed fever, chills, SOB and weakness so his family bright him to ED for evaluation. In ED he was found to be hypotensive, febrile to 103 and tachycardic, CXR portable showed a possible LLL PNA so hospitalists asked to admit for sepsis. Patient denies and current pain, nausea, vomiting or diarrhea. He has some new dysuria, frequency. Worsening SOB and profound weakness, had rigors earlier at home. Mental status is sluggish but he is alert an is able to answer questions appropriately.  Review of Systems: Review of Systems  Constitutional: Positive for fever, chills, weight loss and malaise/fatigue. Negative for diaphoresis.  HENT: Negative for congestion and sore throat.   Eyes: Negative.   Respiratory: Positive for cough and shortness of breath. Negative for sputum production, wheezing and stridor.   Cardiovascular: Negative for chest pain, palpitations, orthopnea and leg swelling.  Gastrointestinal: Negative for heartburn, nausea, vomiting, abdominal pain, diarrhea and constipation.  Genitourinary: Positive for dysuria, frequency and hematuria.  Musculoskeletal: Positive for back pain and joint pain.  Skin: Negative for itching and rash.  Neurological: Positive for weakness and headaches. Negative for dizziness and sensory change.  Psychiatric/Behavioral: Negative for depression and memory loss. The patient is not nervous/anxious and does not have insomnia.   All other systems reviewed and are negative.    Past Medical History  Diagnosis Date  .  Hyperlipidemia, mild   . Increased prostate specific antigen (PSA) velocity   . Hypertension   . Urothelial carcinoma of bladder  . H/O hiatal hernia   . GERD (gastroesophageal reflux disease) occasionlly takes zantac  . Arthritis of shoulder region, left     lt. shoulder  . History of kidney stones   . Frequency of urination   . Urgency of urination   . Nocturia   . Hematuria    Past Surgical History  Procedure Date  . Anterior cervical decomp/discectomy fusion 03-09-2004    C5 - 6/ plates and screws retained  . Echo/ cardiology stress test 04-22-2009  DR [facility]    NORMAL  . Ureterolithotomy MARCH 2012-- [facility]  . Cysto/  resection bladder lesion with bx JULY 2012-- [facility]  . Rotator cuff repair 1997    LEFT  . Inguinal hernia repair 2006    RIGHT  . Cystoscopy w/ retrogrades 11/21/2011    Procedure: CYSTOSCOPY WITH RETROGRADE PYELOGRAM;  Surgeon: Marcine Matar, MD;  Location: [facility];  Service: Urology;  Laterality: Bilateral;  BLADDER BX  1 hour requested for this case    . Ureteral reimplantion 01/14/2012    Procedure: URETERAL REIMPLANT;  Surgeon: Marcine Matar, MD;  Location: [facility];  Service: Urology;  Laterality: Right;  RIGHT SEGMENTAL URETERECTOMY WITH RIGHT URETERAL REIMPLANT    . Ureterectomy 01/14/2012    Procedure: URETERECTOMY;  Surgeon: Marcine Matar, MD;  Location: [facility];  Service: Urology;  Laterality: Right;   Social History:  reports that he quit smoking about 23 years ago. His smoking use included Cigarettes. His smokeless tobacco use includes Chew. He reports that he does not drink alcohol or use illicit drugs. Lives at home independently with  his his wife.  Allergies  Allergen Reactions  . Meloxicam Nausea And Vomiting  . Naproxen Nausea And Vomiting    Family History  Problem Relation Age of Onset  . Stroke Mother   . Cancer Father     Prior to Admission medications   Medication Sig Start Date End Date Taking?  Authorizing Provider  atenolol (TENORMIN) 50 MG tablet Take 50 mg by mouth every morning.     Historical Provider, MD  finasteride (PROSCAR) 5 MG tablet Take 5 mg by mouth every morning.    Historical Provider, MD  fish oil-omega-3 fatty acids 1000 MG capsule Take 2 g by mouth daily.    Historical Provider, MD  Ranitidine HCl (ZANTAC PO) Take 75 mg by mouth daily as needed. For acid reflux     Historical Provider, MD  Tamsulosin HCl (FLOMAX) 0.4 MG CAPS Take 0.4 mg by mouth every evening.    Historical Provider, MD   Physical Exam: Filed Vitals:   04/11/12 2328 04/11/12 2349 04/12/12 0000 04/12/12 0122  BP: 91/50  113/84 76/44  Pulse:    120  Temp:  102.1 F (38.9 C)    TempSrc:  Oral    Resp:   26 25  Height:      Weight:      SpO2:    96%     General:  Pale, ill appearing gentleman, weak sluggish to respond  Eyes: PERRL  ENT: MM dry  Neck: supple, no adenopathy or JVD  Cardiovascular:  Tachycardic, regular no mrg  Respiratory: decreased BS in bases, no wheezing or rhonchi  Abdomen: soft NTm, no HSM, diffuse mild suprapubic pain on palpation  Skin: Pale, no rashes  Musculoskeletal: moves all 4 extremities  Psychiatric: appropriate  Neurologic: non-focal  Labs on Admission:  Basic Metabolic Panel:  Lab 04/11/12 4010  NA 137  K 4.0  CL 102  CO2 24  GLUCOSE 164*  BUN 14  CREATININE 1.82*  CALCIUM 8.9  MG --  PHOS --   Liver Function Tests:  Lab 04/11/12 2336  AST 31  ALT 41  ALKPHOS 79  BILITOT 0.4  PROT 6.7  ALBUMIN 2.4*   No results found for this basename: LIPASE:5,AMYLASE:5 in the last 168 hours No results found for this basename: AMMONIA:5 in the last 168 hours CBC:  Lab 04/11/12 2336  WBC 18.6*  NEUTROABS 16.8*  HGB 13.2  HCT 42.4  MCV 85.5  PLT 245    Radiological Exams on Admission: Dg Chest Portable 1 View  04/12/2012  *RADIOLOGY REPORT*  Clinical Data: Shortness of breath and hypertension  PORTABLE CHEST - 1 VIEW   Comparison: 03/06/2004  Findings: The lung volumes are low.  No pleural effusion or edema.  Heart size is normal.  Asymmetric opacity in the left base may represent infiltrate and/or atelectasis.  IMPRESSION:  1.  Low lung volumes with the left base opacity.  Cannot rule out pneumonia.   Original Report Authenticated By: Rosealee Albee, M.D.     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 hypersensitivity reaction since his therapy was earlier in the day- known, but rare complication of this therapy. Possible pneumonitis/PNA in left Lower lobe. Elevated WBC, elevated Lactic Acid, Pro Cal pending, Fever 103, tachycardia and  hypotension.    Blood AFB cultures X2  Started broad spectrum ABX including Levaquin for AFB coverage in addition to Vanc and Zosyn  Aggressive Volume resuscitation for shock and ARF.  May need ID consult or phone consult re whether or not to start glucocorticoids  Repeat CXR in AM  Admitted to ICU for close observation, high potential for acute decompensation  Code Status: Full Code Family Communication: Discussed plan with patient and his family at bedside. Disposition Plan: TBD   Time spent: 70 minutes  [facility] Pager [phone]  If 7PM-7AM, please contact night-coverage [email or web address] Password [email or web address] 04/12/2012, 1:49 AM

## 2012-04-12 NOTE — ED Notes (Signed)
[facility] has arrived. Reported to [place], RN prior to their arrival.

## 2012-04-13 ENCOUNTER — Inpatient Hospital Stay (HOSPITAL_COMMUNITY): Payer: Medicare Other

## 2012-04-13 DIAGNOSIS — N3080 Other cystitis without hematuria: Secondary | ICD-10-CM

## 2012-04-13 DIAGNOSIS — C801 Malignant (primary) neoplasm, unspecified: Secondary | ICD-10-CM

## 2012-04-13 DIAGNOSIS — R079 Chest pain, unspecified: Secondary | ICD-10-CM

## 2012-04-13 DIAGNOSIS — I959 Hypotension, unspecified: Secondary | ICD-10-CM

## 2012-04-13 DIAGNOSIS — N179 Acute kidney failure, unspecified: Secondary | ICD-10-CM

## 2012-04-13 LAB — CBC
MCH: 26.4 pg (ref 26.0–34.0)
MCV: 82.5 fL (ref 78.0–100.0)
Platelets: 162 10*3/uL (ref 150–400)
RDW: 15.9 % — ABNORMAL HIGH (ref 11.5–15.5)
WBC: 28.4 10*3/uL — ABNORMAL HIGH (ref 4.0–10.5)

## 2012-04-13 LAB — BASIC METABOLIC PANEL
Calcium: 7.9 mg/dL — ABNORMAL LOW (ref 8.4–10.5)
Creatinine, Ser: 2 mg/dL — ABNORMAL HIGH (ref 0.50–1.35)
GFR calc Af Amer: 38 mL/min — ABNORMAL LOW (ref >90)
Sodium: 137 mEq/L (ref 135–145)

## 2012-04-13 LAB — GLUCOSE, CAPILLARY: Glucose-Capillary: 185 mg/dL — ABNORMAL HIGH (ref 70–99)

## 2012-04-13 LAB — PROCALCITONIN: Procalcitonin: 52.75 ng/mL

## 2012-04-13 MED ORDER — INSULIN ASPART 100 UNIT/ML ~~LOC~~ SOLN
0.0000 [IU] | SUBCUTANEOUS | Status: DC
  Administered 2012-04-13: 3 [IU] via SUBCUTANEOUS

## 2012-04-13 MED ORDER — HYDROCORTISONE SOD SUCCINATE 100 MG IJ SOLR
50.0000 mg | Freq: Two times a day (BID) | INTRAMUSCULAR | Status: DC
  Administered 2012-04-14 – 2012-04-15 (×3): 50 mg via INTRAVENOUS
  Filled 2012-04-13 (×5): qty 1

## 2012-04-13 MED ORDER — INSULIN ASPART 100 UNIT/ML ~~LOC~~ SOLN
0.0000 [IU] | SUBCUTANEOUS | Status: DC
  Administered 2012-04-13: 7 [IU] via SUBCUTANEOUS
  Administered 2012-04-14: 4 [IU] via SUBCUTANEOUS
  Administered 2012-04-14: 3 [IU] via SUBCUTANEOUS
  Administered 2012-04-14: 4 [IU] via SUBCUTANEOUS

## 2012-04-13 MED ORDER — DEXTROSE 10 % IV SOLN: INTRAVENOUS | Status: DC | PRN

## 2012-04-13 NOTE — H&P (Signed)
Name: Frank Delacruz MRN: [id number] DOB: 1946-02-21    LOS: 2  Referring Provider:  Mendel Corning  Reason for Referral:  Septic shock  PULMONARY / CRITICAL CARE MEDICINE  Brief patient description:  66 year-old male with septic shock after BCG installation, transferred from   Events Since Admission: Transfer ICU   Current Status: 9/1 Weaned off pressors ~12 noon today  Up in chair, feeling better   Vital Signs: Temp:  [97.7 F (36.5 C)-98.8 F (37.1 C)] 98 F (36.7 C) (09/01 1200) Pulse Rate:  [72-105] 86  (09/01 1245) Resp:  [15-25] 18  (09/01 1245) BP: (83-132)/(55-98) 101/59 mmHg (09/01 1245) SpO2:  [93 %-100 %] 97 % (09/01 1245) Weight:  [88.8 kg (195 lb 12.3 oz)] 88.8 kg (195 lb 12.3 oz) (09/01 0457)  Physical Examination: General:  Elderly , NAD  Neuro:  A/ox 3  HEENT: intact  Neck:  Supple Cardiovascular:  Normal rate regular rhythm, 2/6 systolic murmur over the left precordium Lungs:  Clear to auscultation bilaterally.  No wheezes or crackles Abdomen:  Soft, mild tenderness palpation, 2+ bowel sounds, no appreciated hepatosplenomegaly Musculoskeletal:   No joint abnormalities, no clubbing, cyanosis, or edema, peripheral pulses 2+ Skin:  No rash  Principal Problem:  *Sepsis Active Problems:  Cystitis due to intravesical BCG administration  Hypotension  PNA (pneumonia)  Fever  Acute renal failure   ASSESSMENT AND PLAN  PULMONARY  Lab 04/12/12 0656  PHART --  PCO2ART --  PO2ART --  HCO3 --  O2SAT 70.4   Ventilator Settings:   CXR:  Slight worsening of bibasilar airspace disease, left greater  than right  ETT:  Patient is not intubated  A:  Bilateral PNA vs Pnumonitis from BCG  Favor PNA as PCT elevated in setting of sepsis (however sepsis post BCG is also a side effect)   P:   - cont broad spect abx to cover for [facility] -PNA  -  Pulmonary hygiene  - cont on steroids -favor pneumonitis over infectious in lings  CARDIOVASCULAR  Lab  04/12/12 1728 04/11/12 2336  TROPONINI -- --  LATICACIDVEN 3.7* 5.1*  PROBNP -- --    Lab 04/13/12 0451 04/12/12 0530 04/11/12 2336  PROCALCITON 52.75 75.28 7.48    ECG:  Sinus tachycardia Lines:  R. IJ CVL 8/31   A: Hypotension, associated with septic shock>? BCG reaction  Weaned off pressors 9/1 ~noon , lactate and PCT  tr down  P:  - cont IVF  -monitor b/p closely   RENAL  Lab 04/13/12 0451 04/12/12 1637 04/12/12 1540 04/12/12 0530 04/11/12 2336  NA 137 -- 135 -- 137  K 4.1 -- 5.0 -- --  CL 108 -- 106 -- 102  CO2 22 -- 16* -- 24  BUN 27* -- 21 -- 14  CREATININE 2.00* -- 2.33* 2.34* 1.82*  CALCIUM 7.9* -- 7.4* -- 8.9  MG -- 1.2* -- 1.0* --  PHOS -- -- -- -- --   Intake/Output      08/31 0701 - 09/01 0700 09/01 0701 - 09/02 0700   I.V. (mL/kg) 3000 (33.8) 750 (8.4)   IV Piggyback 262.5    Total Intake(mL/kg) 3262.5 (36.7) 750 (8.4)   Urine (mL/kg/hr) 2370 (1.1) 425 (0.7)   Total Output 2370 425   Net +892.5 +325        Stool Occurrence 4 x 1 x      A:  Renal insufficiency , baseline 0.6  Renal US 8/31 neg hydronephrosis , improved UOP  with foley /flomax  Metabolic acidosis improved with Bicarb   P:   Follow bmet  D/c bicarb drip   GASTROINTESTINAL  Lab 04/12/12 0530 04/11/12 2336  AST 35 31  ALT 32 41  ALKPHOS 54 79  BILITOT 0.4 0.4  PROT 5.2* 6.7  ALBUMIN 1.8* 2.4*    A:  No active issues  -LFT nml  P:   - cont to monitor   HEMATOLOGIC  Lab 04/13/12 0451 04/12/12 1540 04/12/12 0530 04/11/12 2336  HGB 9.8* 10.9* 10.6* 13.2  HCT 30.6* 35.4* 35.1* 42.4  PLT 162 184 177 245  INR -- -- 1.30 --  APTT -- -- 35 --   A:  Anemia  P:  Check cbc in am  Heparin for DVT prophylaxis Check stool  For occult blood   INFECTIOUS  Lab 04/13/12 0451 04/12/12 1540 04/12/12 0530 04/11/12 2336  WBC 28.4* 34.4* 35.8* 18.6*  PROCALCITON 52.75 -- 75.28 7.48   Cultures:  8/30 BC x 2 > 8.30 UC > 8/31 AFB >>  Antibiotics: 8/31 vancomycin> 8/31 Zosyn  > 8/31 isoniazid >>  A:  Concern for either disseminated BCG after BCG installation for bladder carcinoma or septic shock from UTI or other source P:   Cont  vancomycin and Zosyn, want to narrow this in am or per ID Discuss cont use of  isoniazid for possible disseminated BCG  Consider ID consult  ENDOCRINE No results found for this basename: GLUCAP:5 in the last 168 hours A:   Hyperglycemia   P:   Stress dose steroids -on board for possible BCG reaction/pneumonitis  Add SSI When we narrow steroids for rel ai, want to continue some steroids for pneumonitis lung from BCG?  NEUROLOGIC  A:   no current problems P:   Monitor mental status   BEST PRACTICE / DISPOSITION Level of Care:   ICU  Primary Service:   PCCM  Consultants:   none  Code Status:   full  Diet:  N.p.o.  DVT Px:   heparin, and SCDs  GI Px:  Not indicated  Skin Integrity:   good  Social / Family:   family at bedside   I spent 30 minutes of direct critical care with this patient.  Rubye Oaks, NP  Pulmonary and Critical Care Medicine [facility] Pager: [phone]  04/13/2012, 2:02 PM  I have fully examined this patient and agree with above findings.    And edited in full  Mcarthur Rossetti. Tyson Alias, MD, FACP Pgr: [phone] [facility]

## 2012-04-13 NOTE — Progress Notes (Signed)
eLink Physician-Brief Progress Note Patient Name: Frank Delacruz DOB: 21-Jun-1946 MRN: [id number]  Date of Service  04/13/2012   HPI/Events of Note     eICU Interventions  Dc sepsis protocol Change to SSI order set Drop hydrocort 50 q 12   Intervention Category Intermediate Interventions: Hyperglycemia - evaluation and treatment  [name] V. 04/13/2012, 8:39 PM

## 2012-04-14 ENCOUNTER — Inpatient Hospital Stay (HOSPITAL_COMMUNITY): Payer: Medicare Other

## 2012-04-14 DIAGNOSIS — N3080 Other cystitis without hematuria: Secondary | ICD-10-CM

## 2012-04-14 DIAGNOSIS — J189 Pneumonia, unspecified organism: Secondary | ICD-10-CM

## 2012-04-14 DIAGNOSIS — A419 Sepsis, unspecified organism: Secondary | ICD-10-CM

## 2012-04-14 DIAGNOSIS — N179 Acute kidney failure, unspecified: Secondary | ICD-10-CM

## 2012-04-14 LAB — COMPREHENSIVE METABOLIC PANEL
ALT: 31 U/L (ref 0–53)
Alkaline Phosphatase: 48 U/L (ref 39–117)
BUN: 29 mg/dL — ABNORMAL HIGH (ref 6–23)
CO2: 22 mEq/L (ref 19–32)
Chloride: 109 mEq/L (ref 96–112)
GFR calc Af Amer: 45 mL/min — ABNORMAL LOW (ref >90)
Glucose, Bld: 133 mg/dL — ABNORMAL HIGH (ref 70–99)
Potassium: 2.7 mEq/L — CL (ref 3.5–5.1)
Sodium: 141 mEq/L (ref 135–145)
Total Bilirubin: 0.2 mg/dL — ABNORMAL LOW (ref 0.3–1.2)
Total Protein: 5.5 g/dL — ABNORMAL LOW (ref 6.0–8.3)

## 2012-04-14 LAB — CBC
HCT: 29.2 % — ABNORMAL LOW (ref 39.0–52.0)
Hemoglobin: 9.5 g/dL — ABNORMAL LOW (ref 13.0–17.0)
RBC: 3.64 MIL/uL — ABNORMAL LOW (ref 4.22–5.81)
WBC: 19.6 10*3/uL — ABNORMAL HIGH (ref 4.0–10.5)

## 2012-04-14 LAB — GLUCOSE, CAPILLARY
Glucose-Capillary: 128 mg/dL — ABNORMAL HIGH (ref 70–99)
Glucose-Capillary: 164 mg/dL — ABNORMAL HIGH (ref 70–99)
Glucose-Capillary: 111 mg/dL — ABNORMAL HIGH (ref 70–99)
Glucose-Capillary: 164 mg/dL — ABNORMAL HIGH (ref 70–99)

## 2012-04-14 LAB — URINE CULTURE: Colony Count: NO GROWTH

## 2012-04-14 MED ORDER — POTASSIUM CHLORIDE 10 MEQ/50ML IV SOLN
10.0000 meq | INTRAVENOUS | Status: AC
  Administered 2012-04-14 (×4): 10 meq via INTRAVENOUS
  Filled 2012-04-14: qty 50
  Filled 2012-04-14: qty 100
  Filled 2012-04-14: qty 50

## 2012-04-14 MED ORDER — VANCOMYCIN HCL 1000 MG IV SOLR
750.0000 mg | Freq: Two times a day (BID) | INTRAVENOUS | Status: DC
  Administered 2012-04-14: 750 mg via INTRAVENOUS
  Filled 2012-04-14 (×2): qty 750

## 2012-04-14 MED ORDER — INSULIN ASPART 100 UNIT/ML ~~LOC~~ SOLN: 0.0000 [IU] | Freq: Three times a day (TID) | SUBCUTANEOUS | Status: DC

## 2012-04-14 MED ORDER — SODIUM CHLORIDE 3 % IN NEBU
3.0000 mL | INHALATION_SOLUTION | Freq: Three times a day (TID) | RESPIRATORY_TRACT | Status: AC
  Administered 2012-04-15 (×2): 3 mL via RESPIRATORY_TRACT
  Filled 2012-04-14 (×3): qty 15

## 2012-04-14 NOTE — Consult Note (Signed)
[facility]    Date of Admission:  04/11/2012  Date of Consult:  04/14/2012  Reason for Consult Sepsis after BCG therapy for bladder cancer Referring Physician: Dr. Marchelle Gearing   HPI: Frank Delacruz is an 66 y.o. male.  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 Retta Diones.  He received 6 cycles last year withour problems per his account, though his wife believes he may have been ill between treatments.   During his 2nd most recent rx he became febrile, ills and weak and ultimately was dx with UTI and rx with abx with delay in most recent instillation of BCG.   He received last rx on Frida y and became febrile with shaking rigors, SOB weakness and presyncope. His wife called 911 at his request and he was brought to the ED where he wasfound to be hypotensive, febrile to 103 and tachycardic, CXR portable showed a possible LLL PNA. He was admitted by [facility] and then to [facility]. He responded well to broad spectrum abx (vancomycin, levaquin and zosyn along with  Isoniazid, fluid rescusitation and pressors. We were consulted due to concern for possible disseminated BCG infection.   Past Medical History  Diagnosis Date  . Hyperlipidemia, mild   . Increased prostate specific antigen (PSA) velocity   . Hypertension   . Urothelial carcinoma of bladder  . H/O hiatal hernia   . GERD (gastroesophageal reflux disease) occasionlly takes zantac  . Arthritis of shoulder region, left     lt. shoulder  . History of kidney stones   . Frequency of urination   . Urgency of urination   . Nocturia   . Hematuria     Past Surgical History  Procedure Date  . Anterior cervical decomp/discectomy fusion 03-09-2004    C5 - 6/ plates and screws retained  . Echo/ cardiology stress test 04-22-2009  DR [facility]    NORMAL  . Ureterolithotomy MARCH 2012-- [facility]  . Cysto/  resection bladder lesion with bx JULY  2012-- [facility]  . Rotator cuff repair 1997    LEFT  . Inguinal hernia repair 2006    RIGHT  . Cystoscopy w/ retrogrades 11/21/2011    Procedure: CYSTOSCOPY WITH RETROGRADE PYELOGRAM;  Surgeon: Marcine Matar, MD;  [facility];  Service: Urology;  Laterality: Bilateral;  BLADDER BX  1 hour requested for this case    . Ureteral reimplantion 01/14/2012    Procedure: URETERAL REIMPLANT;  Surgeon: Marcine Matar, MD;  Location: [facility];  Service: Urology;  Laterality: Right;  RIGHT SEGMENTAL URETERECTOMY WITH RIGHT URETERAL REIMPLANT    . Ureterectomy 01/14/2012    Procedure: URETERECTOMY;  Surgeon: Marcine Matar, MD;  Location: [facility];  Service: Urology;  Laterality: Right;  ergies:   Allergies  Allergen Reactions  . Meloxicam Nausea And Vomiting  . Naproxen Nausea And Vomiting     Medications: I have reviewed patients current medications as documented in Epic Anti-infectives     Start     Dose/Rate Route Frequency Ordered Stop   04/14/12 1100   vancomycin (VANCOCIN) 750 mg in sodium chloride 0.9 % 150 mL IVPB        750 mg 150 mL/hr over 60 Minutes Intravenous Every 12 hours 04/14/12 0953     04/12/12 2200   vancomycin (VANCOCIN) 750 mg in sodium chloride 0.9 % 150 mL IVPB  Status:  Discontinued  750 mg 150 mL/hr over 60 Minutes Intravenous Every 24 hours 04/12/12 1120 04/14/12 0953   04/12/12 1400  piperacillin-tazobactam (ZOSYN) IVPB 3.375 g       3.375 g 12.5 mL/hr over 240 Minutes Intravenous 3 times per day 04/12/12 1121     04/12/12 1000   isoniazid (NYDRAZID) tablet 300 mg        300 mg Oral Daily 04/12/12 0547 04/14/12 0921   04/12/12 0600   piperacillin-tazobactam (ZOSYN) IVPB 2.25 g  Status:  Discontinued        2.25 g 100 mL/hr over 30 Minutes Intravenous 4 times per day 04/12/12 0514 04/12/12 1121   04/12/12 0145   levofloxacin (LEVAQUIN) IVPB 750 mg  Status:  Discontinued     Comments: Start after AFB blood cultures are drawn        750 mg 100 mL/hr over 90 Minutes Intravenous Every 24 hours 04/12/12 0137 04/12/12 0514   04/12/12 0045  piperacillin-tazobactam (ZOSYN) IVPB 3.375 g       3.375 g 12.5 mL/hr over 240 Minutes Intravenous  Once 04/12/12 0031 04/12/12 0115   04/12/12 0045   vancomycin (VANCOCIN) IVPB 1000 mg/200 mL premix        1,000 mg 200 mL/hr over 60 Minutes Intravenous  Once 04/12/12 0031 04/12/12 0225          Social History:  reports that he quit smoking about 23 years ago. His smoking use included Cigarettes. His smokeless tobacco use includes Chew. He reports that he does not drink alcohol or use illicit drugs.  Family History  Problem Relation Age of Onset  . Stroke Mother   . Cancer Father     As in HPI and primary teams notes otherwise 12 point review of systems is negative  Blood pressure 127/68, pulse 71, temperature 98.3 F (36.8 C), temperature source Oral, resp. rate 20, height 5\' 6"  (1.676 m), weight 197 lb 1.5 oz (89.4 kg), SpO2 99.00%. General: Alert and awake, oriented x3, not in any acute distress. HEENT: anicteric sclera, pupils reactive to light and accommodation, EOMI, oropharynx clear and without exudate CVS regular rate, normal r,  no murmur rubs or gallops Chest: fairly clear to auscultation bilaterally, no wheezing, rales or rhonchi Abdomen: soft nontender, nondistended, normal bowel sounds, Extremities: no  clubbing or edema noted bilaterally Skin: no rashes Neuro: nonfocal, strength and sensation intact   Results for orders placed during the hospital encounter of 04/11/12 (from the past 48 hour(s))  CBC     Status: Abnormal   Collection Time   04/13/12  4:51 AM      Component Value Range Comment   WBC 28.4 (*) 4.0 - 10.5 K/uL    RBC 3.71 (*) 4.22 - 5.81 MIL/uL    Hemoglobin 9.8 (*) 13.0 - 17.0 g/dL    HCT 16.1 (*) 09.6 - 52.0 %    MCV 82.5  78.0 - 100.0 fL    MCH 26.4  26.0 - 34.0 pg    MCHC 32.0  30.0 - 36.0 g/dL    RDW 04.5 (*) 40.9 - 15.5 %     Platelets 162  150 - 400 K/uL   BASIC METABOLIC PANEL     Status: Abnormal   Collection Time   04/13/12  4:51 AM      Component Value Range Comment   Sodium 137  135 - 145 mEq/L    Potassium 4.1  3.5 - 5.1 mEq/L NO VISIBLE HEMOLYSIS   Chloride 108  96 -  112 mEq/L    CO2 22  19 - 32 mEq/L    Glucose, Bld 288 (*) 70 - 99 mg/dL    BUN 27 (*) 6 - 23 mg/dL    Creatinine, Ser 1.61 (*) 0.50 - 1.35 mg/dL    Calcium 7.9 (*) 8.4 - 10.5 mg/dL    GFR calc non Af Amer 33 (*) >90 mL/min    GFR calc Af Amer 38 (*) >90 mL/min   PROCALCITONIN     Status: Normal   Collection Time   04/13/12  4:51 AM      Component Value Range Comment   Procalcitonin 52.75     GLUCOSE, CAPILLARY     Status: Abnormal   Collection Time   04/13/12  4:34 PM      Component Value Range Comment   Glucose-Capillary 185 (*) 70 - 99 mg/dL   GLUCOSE, CAPILLARY     Status: Abnormal   Collection Time   04/13/12  7:36 PM      Component Value Range Comment   Glucose-Capillary 212 (*) 70 - 99 mg/dL    Comment 1 Notify RN     GLUCOSE, CAPILLARY     Status: Abnormal   Collection Time   04/13/12 11:47 PM      Component Value Range Comment   Glucose-Capillary 113 (*) 70 - 99 mg/dL   GLUCOSE, CAPILLARY     Status: Abnormal   Collection Time   04/14/12  3:32 AM      Component Value Range Comment   Glucose-Capillary 128 (*) 70 - 99 mg/dL   CBC     Status: Abnormal   Collection Time   04/14/12  5:00 AM      Component Value Range Comment   WBC 19.6 (*) 4.0 - 10.5 K/uL    RBC 3.64 (*) 4.22 - 5.81 MIL/uL    Hemoglobin 9.5 (*) 13.0 - 17.0 g/dL    HCT 09.6 (*) 04.5 - 52.0 %    MCV 80.2  78.0 - 100.0 fL    MCH 26.1  26.0 - 34.0 pg    MCHC 32.5  30.0 - 36.0 g/dL    RDW 40.9 (*) 81.1 - 15.5 %    Platelets 170  150 - 400 K/uL   COMPREHENSIVE METABOLIC PANEL     Status: Abnormal   Collection Time   04/14/12  5:00 AM      Component Value Range Comment   Sodium 141  135 - 145 mEq/L    Potassium 2.7 (*) 3.5 - 5.1 mEq/L    Chloride 109  96 - 112  mEq/L    CO2 22  19 - 32 mEq/L    Glucose, Bld 133 (*) 70 - 99 mg/dL    BUN 29 (*) 6 - 23 mg/dL    Creatinine, Ser 9.14 (*) 0.50 - 1.35 mg/dL    Calcium 8.1 (*) 8.4 - 10.5 mg/dL    Total Protein 5.5 (*) 6.0 - 8.3 g/dL    Albumin 1.9 (*) 3.5 - 5.2 g/dL    AST 36  0 - 37 U/L    ALT 31  0 - 53 U/L    Alkaline Phosphatase 48  39 - 117 U/L    Total Bilirubin 0.2 (*) 0.3 - 1.2 mg/dL    GFR calc non Af Amer 39 (*) >90 mL/min    GFR calc Af Amer 45 (*) >90 mL/min   GLUCOSE, CAPILLARY     Status: Abnormal   Collection Time  04/14/12  8:06 AM      Component Value Range Comment   Glucose-Capillary 153 (*) 70 - 99 mg/dL   GLUCOSE, CAPILLARY     Status: Abnormal   Collection Time   04/14/12 11:47 AM      Component Value Range Comment   Glucose-Capillary 164 (*) 70 - 99 mg/dL   GLUCOSE, CAPILLARY     Status: Abnormal   Collection Time   04/14/12  4:52 PM      Component Value Range Comment   Glucose-Capillary 111 (*) 70 - 99 mg/dL    Comment 1 Notify RN         Component Value Date/Time   SDES URINE, CATHETERIZED 04/12/2012 0159   SPECREQUEST NONE 04/12/2012 0159   CULT NO GROWTH 04/12/2012 0159   REPTSTATUS 04/14/2012 FINAL 04/12/2012 0159   Ct Chest Wo Contrast  04/14/2012  *RADIOLOGY REPORT*  Clinical Data: Left basilar airspace opacity on the portable chest radiograph earlier today.  Clinical concern for tuberculosis.  CT CHEST WITHOUT CONTRAST  Technique:  Multidetector CT imaging of the chest was performed following the standard protocol without IV contrast.  Comparison: Portable chest obtained earlier today.  Findings: Small bilateral pleural effusions.  Adjacent bilateral lower lobe atelectasis.  Otherwise, the lungs are clear.  5 mm probable calcified subpleural nodule in the posteromedial right upper lobe on image number 10.  Additional 4 mm noncalcified subpleural nodule in the posteromedial right upper lobe on image number 15.  No other lung masses and no enlarged lymph nodes.  Small amount  of coronary artery calcification.  Right ureteral stent on the frontal scout image.  Associated air in the right renal collecting system.  Thoracic spine degenerative changes, including changes of DISH.  IMPRESSION:  1.  Small bilateral pleural effusions. 2.  Mild bilateral lower lobe atelectasis. 3.  Two small, right upper lobe subpleural nodules, as described above.  These are most likely related to previous granulomatous infection.  This could be further evaluated with a follow-up chest CT without contrast in 1 year. 4.  No findings suspicious for tuberculosis infection.   Original Report Authenticated By: Darrol Angel, M.D.    Dg Chest Port 1 View  04/14/2012  *RADIOLOGY REPORT*  Clinical Data: Shortness of breath and pneumonia.  PORTABLE CHEST - 1 VIEW  Comparison: 1 day prior  Findings: Lower cervical spine fixation.  Right internal jugular line unchanged with tip at low SVC.  Midline trachea.  Normal heart size.  No right and no definite left pleural fluid. No pneumothorax.  Lung volumes are mildly low. Slight worsening left base air space disease.  Minimal right base atelectasis is similar.  IMPRESSION: Slight worsening in left base air space disease, suspicious for infection.   Original Report Authenticated By: Consuello Bossier, M.D.    Dg Chest Port 1 View  04/13/2012  *RADIOLOGY REPORT*  Clinical Data: Pneumonia  PORTABLE CHEST - 1 VIEW  Comparison:   the previous day's study  Findings: Right IJ central line stable position.  Slight increase in the patchy airspace disease at the lung bases, left greater than right.  Heart size is normal.  No effusion.  No overt edema.  IMPRESSION:  1.  Slight worsening of bibasilar airspace disease, left greater than right   Original Report Authenticated By: Osa Craver, M.D.      Recent Results (from the past 720 hour(s))  CULTURE, BLOOD (ROUTINE X 2)     Status: Normal (Preliminary result)  Collection Time   04/11/12 11:36 PM      Component Value  Range Status Comment   Specimen Description RIGHT ANTECUBITAL   Final    Special Requests BOTTLES DRAWN AEROBIC AND ANAEROBIC 6CC   Final    Culture NO GROWTH 3 DAYS   Final    Report Status PENDING   Incomplete   CULTURE, BLOOD (ROUTINE X 2)     Status: Normal (Preliminary result)   Collection Time   04/11/12 11:43 PM      Component Value Range Status Comment   Specimen Description BLOOD RIGHT HAND   Final    Special Requests BOTTLES DRAWN AEROBIC AND ANAEROBIC 6CC   Final    Culture NO GROWTH 3 DAYS   Final    Report Status PENDING   Incomplete   URINE CULTURE     Status: Normal   Collection Time   04/12/12  1:59 AM      Component Value Range Status Comment   Specimen Description URINE, CATHETERIZED   Final    Special Requests NONE   Final    Culture  Setup Time 04/12/2012 19:27   Final    Colony Count NO GROWTH   Final    Culture NO GROWTH   Final    Report Status 04/14/2012 FINAL   Final   MRSA PCR SCREENING     Status: Normal   Collection Time   04/12/12  4:56 AM      Component Value Range Status Comment   MRSA by PCR NEGATIVE  NEGATIVE Final      Impression/Recommendation  66 year old man with sepsis after BCG instillation  1) Sepsis after BCG instillation: I am concerned that he has actual BCG disseminated infection. It will be a DIFFICULT dx to prove or disprove since dx often requires tissue biopsy.  I would check blood AFB cultures  Urine AFB cultures  Have Resp THerapy induce 3 AFB sputa  (keeping in mind yield on these may be low, esp in lghtt of fact pt received INH, Levaquin  And then I would start on empiric INH, Rifampin and Ethambutol (BUT NOT PYR). ONe could also swap in Avelox for one of these drugs and would proceed to treat pt for dissemianted BCG.  We could then see pt in [facility] in followup 6 weeks from now to decide whether to push forward with prolonged therapy (my preference)  I would dc vancomycin today and zosyn in the am.  2) Because of rare  cases of human to human transmission of M bovis agree with airborne  Wouild get 3 sets of AFB and then start 3 drugs  Above and dc isolation  3) Screening: check HIV   Dr. Ninetta Lights is here tomorrow  Pts wife Ay cell phone [phone] would like calls with updates  I spent greater than 60 minutes with the patient including greater than 50% of time in face to face counsel of the patient and his wife and in coordination of their care.     Thank you so much for this interesting consult  [facility] [phone] (pager) [phone] (office) 04/14/2012, 6:45 PM  Paulette Blanch Dam 04/14/2012, 6:45 PM

## 2012-04-14 NOTE — Progress Notes (Signed)
ANTIBIOTIC CONSULT NOTE - Follow-up  Pharmacy Consult for vancomycin Indication: UTI/sepsis/?PNA  Allergies  Allergen Reactions  . Meloxicam Nausea And Vomiting  . Naproxen Nausea And Vomiting    Patient Measurements: Height: 5\' 6"  (167.6 cm) Weight: 197 lb 1.5 oz (89.4 kg) IBW/kg (Calculated) : 63.8   Vital Signs: Temp: 98.5 F (36.9 C) (09/02 0807) Temp src: Oral (09/02 0807) BP: 125/70 mmHg (09/02 0700) Pulse Rate: 71  (09/02 0700)  Labs:  Basename 04/14/12 0500 04/13/12 0451 04/12/12 1540  WBC 19.6* 28.4* 34.4*  HGB 9.5* 9.8* 10.9*  PLT 170 162 184  LABCREA -- -- --  CREATININE 1.73* 2.00* 2.33*   Estimated Creatinine Clearance: 43.4 ml/min (by C-G formula based on Cr of 1.73).  Microbiology: Recent Results (from the past 720 hour(s))  CULTURE, BLOOD (ROUTINE X 2)     Status: Normal (Preliminary result)   Collection Time   04/11/12 11:36 PM      Component Value Range Status Comment   Specimen Description RIGHT ANTECUBITAL   Final    Special Requests BOTTLES DRAWN AEROBIC AND ANAEROBIC 6CC   Final    Culture NO GROWTH 3 DAYS   Final    Report Status PENDING   Incomplete   CULTURE, BLOOD (ROUTINE X 2)     Status: Normal (Preliminary result)   Collection Time   04/11/12 11:43 PM      Component Value Range Status Comment   Specimen Description BLOOD RIGHT HAND   Final    Special Requests BOTTLES DRAWN AEROBIC AND ANAEROBIC 6CC   Final    Culture NO GROWTH 3 DAYS   Final    Report Status PENDING   Incomplete   MRSA PCR SCREENING     Status: Normal   Collection Time   04/12/12  4:56 AM      Component Value Range Status Comment   MRSA by PCR NEGATIVE  NEGATIVE Final    Assessment: 66yo male with h/o ureter resection and recent BCG bladder instillation, found hypotensive with possible pneumonitis/PNA in LLL as well as elevated WBC/lactic acid, to begin IV ABX for sepsis; of note, last available labs from June 2013 show SCr <0.8 but Scr was >2 upon admission.  However, Scr has improved to 1.73  Goal of Therapy:  Vancomycin trough level 15-20 mcg/ml  Plan:  1. Increase vancomycin to 750mg  IV Q12H - f/u LOT and possibly de-escalation 2. F/u renal fxn, C&S and trough at [facility] if continued 3. Continue zosyn as ordered  Kelleigh Skerritt, Drake Leach PharmD BCPS 04/14/2012,9:53 AM

## 2012-04-14 NOTE — Progress Notes (Signed)
Hypokalemia   K replaced  

## 2012-04-14 NOTE — H&P (Signed)
Name: Frank Delacruz MRN: [id number] DOB: [birth year]    LOS: 3  Referring Provider:  Mendel Corning  Reason for Referral:  Septic shock  PULMONARY / CRITICAL CARE MEDICINE  Brief patient description:  66 year-old male with septic shock after BCG installation, transferred from   Events Since Admission: 8/31: Transfer ICU  9//1/13: 9/1 Weaned off pressors ~12 noon today     SUBJECTIVE/OVERNIGHT/INTERVAL HX Up in chair, feeling better . Eating going to toilet. Not on oxygen. ID consult involved  Vital Signs: Temp:  [97.9 F (36.6 C)-98.5 F (36.9 C)] 98.5 F (36.9 C) (09/02 0807) Pulse Rate:  [67-109] 71  (09/02 0700) Resp:  [14-23] 16  (09/02 0700) BP: (101-129)/(59-106) 125/70 mmHg (09/02 0700) SpO2:  [94 %-100 %] 95 % (09/02 0700) Weight:  [89.4 kg (197 lb 1.5 oz)] 89.4 kg (197 lb 1.5 oz) (09/02 0500)  Physical Examination: General:  Elderly , NAD  Neuro:  A/ox 3  HEENT: intact  Neck:  Supple Cardiovascular:  Normal rate regular rhythm, 2/6 systolic murmur over the left precordium Lungs:  Clear to auscultation bilaterally.  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  Urinary frequency. History of elevated PSA. Status post ureteric reimplantation.  RENAL/URINARY TRACT ULTRASOUND COMPLETE  Comparison:  Plain films of 03/14/2012.  Renal ultrasound 02/08/2012.  Findings:  Right Kidney:  15.1 cm. No hydronephrosis.  Normal renal cortical thickness and echogenicity.  Left Kidney:  14.9 cm. No hydronephrosis.  Normal renal cortical thickness and  echogenicity.  Bladder:  Collapsed around a Foley catheter.  Incidental note is made of mild hepatic steatosis.  An area of probable sparing is identified within the pericholecystic region of the liver on image 10.  IMPRESSION: No hydronephrosis or acute process.   Original Report Authenticated By: Consuello Bossier, M.D.    Dg Chest Port 1 View  04/14/2012  *RADIOLOGY REPORT*  Clinical Data: Shortness of breath and pneumonia.  PORTABLE CHEST - 1 VIEW  Comparison: 1 day prior  Findings: Lower cervical spine fixation.  Right internal jugular line unchanged with tip at low SVC.  Midline trachea.  Normal heart size.  No right and no definite left pleural fluid. No pneumothorax.  Lung volumes are mildly low. Slight worsening left base air space disease.  Minimal right base atelectasis is similar.  IMPRESSION: Slight worsening in left base air space disease, suspicious for infection.   Original Report Authenticated By: Consuello Bossier, M.D.    Dg Chest Port 1 View  04/13/2012  *RADIOLOGY REPORT*  Clinical Data: Pneumonia  PORTABLE CHEST - 1 VIEW  Comparison:   the previous day's study  Findings: Right IJ central line stable position.  Slight increase in the patchy airspace disease at the lung bases, left greater than right.  Heart size is normal.  No effusion.  No overt edema.  IMPRESSION:  1.  Slight worsening of bibasilar airspace disease, left greater than right   Original Report Authenticated By: Osa Craver, M.D.      ASSESSMENT AND PLAN  PULMONARY  Lab 04/12/12 0656  PHART --  PCO2ART --  PO2ART --  HCO3 --  O2SAT 70.4   Ventilator Settings:   CXR:  9/1: Slight worsening of bibasilar airspace disease, left greater than right  ETT:  Patient is not intubated  A:  Bilateral PNA vs Pnumonitis from BCG   On 04/14/12. On RA but BCG pneumnitis still a concern given SIRs/sepsis presentation   P:   - Airborne Isolation for M Bovis  - CT chest for miliary pattern deteection - dc isolation  if no infiltrates  - AFB sputum per ID  - Might need bronch depending on CTand course - cont broad spect abx to cover for [facility] -PNA  -  Pulmonary hygiene  - cont on steroids   CARDIOVASCULAR  Lab 04/12/12 1728 04/11/12 2336  TROPONINI -- --  LATICACIDVEN 3.7* 5.1*  PROBNP -- --    Lab 04/13/12 0451 04/12/12 0530 04/11/12 2336  PROCALCITON 52.75 75.28 7.48    ECG:  Sinus tachycardia Lines:  R. IJ CVL 8/31   A: Hypotension, associated with septic shock>? BCG reaction  Weaned off pressors 9/1 ~noon , lactate and PCT  tr down    - on 04/14/12: Sirs and clinically sepsis resolved P:  - cont IVF  At 75cc  - check PCT and Lactate 04/15/12   RENAL  Lab 04/14/12 0500 04/13/12 0451 04/12/12 1637 04/12/12 1540 04/12/12 0530 04/11/12 2336  NA 141 137 -- 135 -- 137  K 2.7* 4.1 -- -- -- --  CL 109 108 -- 106 -- 102  CO2 22 22 -- 16* -- 24  BUN 29* 27* -- 21 -- 14  CREATININE 1.73* 2.00* -- 2.33* 2.34* 1.82*  CALCIUM 8.1* 7.9* -- 7.4* -- 8.9  MG -- -- 1.2* -- 1.0* --  PHOS -- -- -- -- -- --   Intake/Output      09/01 0701 - 09/02 0700 09/02 0701 - 09/03 0700   I.V. (mL/kg) 1950 (21.8)    IV Piggyback 337.5 175   Total Intake(mL/kg) 2287.5 (25.6) 175 (2)   Urine (mL/kg/hr) 1850 (0.9)    Total Output 1850    Net +437.5 +175        Urine Occurrence  2 x   Stool Occurrence 4 x 2 x      A:  Renal insufficiency , baseline 0.6  Renal US 8/31 neg hydronephrosis , improved UOP with foley /flomax  Metabolic acidosis improved with Bicarb    - on 04/14/12: Low K repled by elink. Normal bicarb - off bicarb since 04/13/12  P:   Follow bmet    GASTROINTESTINAL  Lab 04/14/12 0500 04/12/12 0530 04/11/12 2336  AST 36 35 31  ALT 31 32 41  ALKPHOS 48 54 79  BILITOT 0.2* 0.4 0.4  PROT 5.5* 5.2* 6.7  ALBUMIN 1.9* 1.8* 2.4*    A:  No active issues  -LFT nml  P:   - cont to monitor   HEMATOLOGIC  Lab 04/14/12 0500 04/13/12 0451 04/12/12 1540 04/12/12 0530 04/11/12 2336  HGB 9.5*  9.8* 10.9* 10.6* 13.2  HCT 29.2* 30.6* 35.4* 35.1* 42.4  PLT 170 162 184 177 245  INR -- -- -- 1.30 --  APTT -- -- -- 35 --   A:  Anemia  P:  Heparin for DVT prophylaxis Check stool  For occult blood  - PRBC for hgb </= 6.9gm%    - exceptions are   -  if ACS susepcted/confirmed then transfuse for hgb </= 8.0gm%,  or    -  If septic shock first 24h and scvo2 < 70% then transfuse for hgb </= 9.0gm%   - active bleeding with hemodynamic instability, then transfuse regardless of hemoglobin value   At at all times try to transfuse 1 unit prbc as possible with exception of active hemorrhage     INFECTIOUS  Lab 04/14/12 0500 04/13/12 0451 04/12/12 1540 04/12/12 0530 04/11/12 2336  WBC 19.6* 28.4* 34.4* 35.8* 18.6*  PROCALCITON -- 52.75 -- 75.28 7.48   Cultures:  8/30 BC x 2 > 8.30 UC > 8/31 AFB >>  Antibiotics: 8/31 vancomycin> 8/31 Zosyn > 8/31 isoniazid >>  A:  Concern for either sepsis syndrome of M Bovis after BCG installation for bladder carcinoma or septic shock from UTI or other source  - on 04/14/12: resolved sepsis syndrome  P:   Cont  vancomycin and Zosyn, want to narrow this in am or per ID ID consult called - dR Daiva Eves - likely needs culture and triple drug ATT Rx  Doubt can get BCG again - willawait ID input  ENDOCRINE  Lab 04/14/12 0806 04/14/12 0332 04/13/12 2347 04/13/12 1936 04/13/12 1634  GLUCAP 153* 128* 113* 212* 185*   A:   Hyperglycemia   P:   Stress dose steroids -on board for possible BCG reaction/pneumonitis  Add SSI   NEUROLOGIC  A:   no current problems P:   Monitor mental status   BEST PRACTICE / DISPOSITION Level of Care:   ICU  -> floor under airborne isolation Primary Service:   PCCM for now till PCT, laactate all cleared Consultants:   none  Code Status:   full  Diet:  N.p.o.  DVT Px:   heparin, and SCDs  GI Px:  Not indicated  Skin Integrity:   good  Social / Family:   family at bedside  04/13/12. None at bedside  04/14/12    D/w Dr Algis Liming  Dr. Kalman Shan, M.D., [facility].C.P Pulmonary and Critical Care Medicine Staff Physician [facility] [place] [facility] Pager: [phone], If no answer or between  15:00h - 7:00h: call [phone]  04/14/2012 11:15 AM

## 2012-04-15 DIAGNOSIS — A419 Sepsis, unspecified organism: Secondary | ICD-10-CM

## 2012-04-15 DIAGNOSIS — R739 Hyperglycemia, unspecified: Secondary | ICD-10-CM | POA: Diagnosis not present

## 2012-04-15 LAB — GLUCOSE, CAPILLARY
Glucose-Capillary: 146 mg/dL — ABNORMAL HIGH (ref 70–99)
Glucose-Capillary: 165 mg/dL — ABNORMAL HIGH (ref 70–99)

## 2012-04-15 LAB — PROCALCITONIN: Procalcitonin: 18.04 ng/mL

## 2012-04-15 LAB — PHOSPHORUS: Phosphorus: 3 mg/dL (ref 2.3–4.6)

## 2012-04-15 MED ORDER — SODIUM CHLORIDE 0.9 % IJ SOLN: 10.0000 mL | Freq: Two times a day (BID) | INTRAMUSCULAR | Status: DC

## 2012-04-15 MED ORDER — SODIUM CHLORIDE 0.9 % IJ SOLN
10.0000 mL | INTRAMUSCULAR | Status: DC | PRN
Start: 2012-04-15 — End: 2012-04-16
  Administered 2012-04-15: 20 mL
  Administered 2012-04-16: 10 mL

## 2012-04-15 NOTE — Progress Notes (Signed)
INFECTIOUS DISEASE PROGRESS NOTE  ID: Frank Delacruz is a 66 y.o. male with  Principal Problem:  *Sepsis Active Problems:  Cystitis due to intravesical BCG administration  Hypotension  Fever  Acute renal failure  Steroid-induced hyperglycemia  Subjective: Feels great  Abtx:  Anti-infectives     Start     Dose/Rate Route Frequency Ordered Stop   04/14/12 1100   vancomycin (VANCOCIN) 750 mg in sodium chloride 0.9 % 150 mL IVPB  Status:  Discontinued        750 mg 150 mL/hr over 60 Minutes Intravenous Every 12 hours 04/14/12 0953 04/14/12 1917   04/12/12 2200   vancomycin (VANCOCIN) 750 mg in sodium chloride 0.9 % 150 mL IVPB  Status:  Discontinued        750 mg 150 mL/hr over 60 Minutes Intravenous Every 24 hours 04/12/12 1120 04/14/12 0953   04/12/12 1400  piperacillin-tazobactam (ZOSYN) IVPB 3.375 g       3.375 g 12.5 mL/hr over 240 Minutes Intravenous 3 times per day 04/12/12 1121     04/12/12 1000   isoniazid (NYDRAZID) tablet 300 mg        300 mg Oral Daily 04/12/12 0547 04/14/12 0921   04/12/12 0600   piperacillin-tazobactam (ZOSYN) IVPB 2.25 g  Status:  Discontinued        2.25 g 100 mL/hr over 30 Minutes Intravenous 4 times per day 04/12/12 0514 04/12/12 1121   04/12/12 0145   levofloxacin (LEVAQUIN) IVPB 750 mg  Status:  Discontinued     Comments: Start after AFB blood cultures are drawn      750 mg 100 mL/hr over 90 Minutes Intravenous Every 24 hours 04/12/12 0137 04/12/12 0514   04/12/12 0045  piperacillin-tazobactam (ZOSYN) IVPB 3.375 g       3.375 g 12.5 mL/hr over 240 Minutes Intravenous  Once 04/12/12 0031 04/12/12 0115   04/12/12 0045   vancomycin (VANCOCIN) IVPB 1000 mg/200 mL premix        1,000 mg 200 mL/hr over 60 Minutes Intravenous  Once 04/12/12 0031 04/12/12 0225          Medications:  Scheduled:   . antiseptic oral rinse  15 mL Mouth Rinse BID  . docusate sodium  100 mg Oral BID  . finasteride  5 mg Oral q morning - 10a  .  pantoprazole  40 mg Oral QHS  . piperacillin-tazobactam (ZOSYN)  IV  3.375 g Intravenous Q8H  . sodium chloride  3 mL Intravenous Q12H  . sodium chloride HYPERTONIC  3 mL Nebulization Q8H  . Tamsulosin HCl  0.4 mg Oral QPM  . DISCONTD: heparin  5,000 Units Subcutaneous Q8H  . DISCONTD: hydrocortisone sodium succinate  50 mg Intravenous Q12H  . DISCONTD: insulin aspart  0-20 Units Subcutaneous TID WC  . DISCONTD: vancomycin  750 mg Intravenous Q12H    Objective: Vital signs in last 24 hours: Temp:  [97.6 F (36.4 C)-98.4 F (36.9 C)] 98.4 F (36.9 C) (09/03 1342) Pulse Rate:  [54-89] 89  (09/03 1342) Resp:  [18-20] 18  (09/03 1342) BP: (121-132)/(68-81) 121/75 mmHg (09/03 1342) SpO2:  [94 %-99 %] 98 % (09/03 1342) Weight:  [89 kg (196 lb 3.4 oz)] 89 kg (196 lb 3.4 oz) (09/03 0622)   General appearance: alert, cooperative and no distress Resp: clear to auscultation bilaterally Cardio: regular rate and rhythm GI: normal findings: bowel sounds normal and soft, non-tender Extremities: edema 3+ bilaterally  Lab Results  Basename 04/14/12 0500  04/13/12 0451  WBC 19.6* 28.4*  HGB 9.5* 9.8*  HCT 29.2* 30.6*  NA 141 137  K 2.7* 4.1  CL 109 108  CO2 22 22  BUN 29* 27*  CREATININE 1.73* 2.00*  GLU -- --   Liver Panel  Basename 04/14/12 0500  PROT 5.5*  ALBUMIN 1.9*  AST 36  ALT 31  ALKPHOS 48  BILITOT 0.2*  BILIDIR --  IBILI --   Sedimentation Rate No results found for this basename: ESRSEDRATE in the last 72 hours C-Reactive Protein No results found for this basename: CRP:2 in the last 72 hours  Microbiology: Recent Results (from the past 240 hour(s))  CULTURE, BLOOD (ROUTINE X 2)     Status: Normal (Preliminary result)   Collection Time   04/11/12 11:36 PM      Component Value Range Status Comment   Specimen Description RIGHT ANTECUBITAL   Final    Special Requests BOTTLES DRAWN AEROBIC AND ANAEROBIC 6CC   Final    Culture NO GROWTH 4 DAYS   Final    Report  Status PENDING   Incomplete   CULTURE, BLOOD (ROUTINE X 2)     Status: Normal (Preliminary result)   Collection Time   04/11/12 11:43 PM      Component Value Range Status Comment   Specimen Description BLOOD RIGHT HAND   Final    Special Requests BOTTLES DRAWN AEROBIC AND ANAEROBIC 6CC   Final    Culture NO GROWTH 4 DAYS   Final    Report Status PENDING   Incomplete   URINE CULTURE     Status: Normal   Collection Time   04/12/12  1:59 AM      Component Value Range Status Comment   Specimen Description URINE, CATHETERIZED   Final    Special Requests NONE   Final    Culture  Setup Time 04/12/2012 19:27   Final    Colony Count NO GROWTH   Final    Culture NO GROWTH   Final    Report Status 04/14/2012 FINAL   Final   MRSA PCR SCREENING     Status: Normal   Collection Time   04/12/12  4:56 AM      Component Value Range Status Comment   MRSA by PCR NEGATIVE  NEGATIVE Final   AFB CULTURE, BLOOD     Status: Normal (Preliminary result)   Collection Time   04/14/12 11:05 AM      Component Value Range Status Comment   Specimen Description BLOOD ARM LEFT   Final    Special Requests AFB/3.5CC   Final    Culture     Final    Value: CULTURE WILL BE EXAMINED FOR 6 WEEKS BEFORE ISSUING A FINAL REPORT   Report Status PENDING   Incomplete     Studies/Results: Ct Chest Wo Contrast  04/14/2012  *RADIOLOGY REPORT*  Clinical Data: Left basilar airspace opacity on the portable chest radiograph earlier today.  Clinical concern for tuberculosis.  CT CHEST WITHOUT CONTRAST  Technique:  Multidetector CT imaging of the chest was performed following the standard protocol without IV contrast.  Comparison: Portable chest obtained earlier today.  Findings: Small bilateral pleural effusions.  Adjacent bilateral lower lobe atelectasis.  Otherwise, the lungs are clear.  5 mm probable calcified subpleural nodule in the posteromedial right upper lobe on image number 10.  Additional 4 mm noncalcified subpleural nodule in  the posteromedial right upper lobe on image number 15.  No other  lung masses and no enlarged lymph nodes.  Small amount of coronary artery calcification.  Right ureteral stent on the frontal scout image.  Associated air in the right renal collecting system.  Thoracic spine degenerative changes, including changes of DISH.  IMPRESSION:  1.  Small bilateral pleural effusions. 2.  Mild bilateral lower lobe atelectasis. 3.  Two small, right upper lobe subpleural nodules, as described above.  These are most likely related to previous granulomatous infection.  This could be further evaluated with a follow-up chest CT without contrast in 1 year. 4.  No findings suspicious for tuberculosis infection.   Original Report Authenticated By: Darrol Angel, M.D.    Dg Chest Port 1 View  04/14/2012  *RADIOLOGY REPORT*  Clinical Data: Shortness of breath and pneumonia.  PORTABLE CHEST - 1 VIEW  Comparison: 1 day prior  Findings: Lower cervical spine fixation.  Right internal jugular line unchanged with tip at low SVC.  Midline trachea.  Normal heart size.  No right and no definite left pleural fluid. No pneumothorax.  Lung volumes are mildly low. Slight worsening left base air space disease.  Minimal right base atelectasis is similar.  IMPRESSION: Slight worsening in left base air space disease, suspicious for infection.   Original Report Authenticated By: Consuello Bossier, M.D.      Assessment/Plan: Cx (-) Sepsis syndrome, ? Urine source ? BCG-osis  Total days of antibiotics 4    zosyn    Day 4 Would- plan for d/c with augmentin for 10 days D/c respiratory isolation. Have him f/u in ID clinic  Comment- BCG-osis is certainly possible albeit rare. Would like him to f/u with [place] so we can see his Cx. His CT scan does not suggest active disease for which he needs respiratory isolation (old granulomatous disease). Very difficult case          Johny Sax Infectious Diseases [phone] 04/15/2012, 5:12 PM   LOS: 4  days

## 2012-04-15 NOTE — Progress Notes (Addendum)
Name: Frank Delacruz MRN: [id number] DOB: 02-15-1946    LOS: 4  Referring Provider:  Mendel Corning  Reason for Referral:  Septic shock  PULMONARY / CRITICAL CARE MEDICINE  Brief patient description:  66 year-old male with septic shock after BCG installation, transferred from   Events Since Admission: 8/31: Transfer ICU  9//1/13: 9/1 Weaned off pressors ~12 noon today     SUBJECTIVE/OVERNIGHT/INTERVAL HX Up in chair, feeling better . Eating going to toilet. Not on oxygen. ID consult involved  Vital Signs: Temp:  [97.6 F (36.4 C)-98.9 F (37.2 C)] 97.7 F (36.5 C) (09/03 0622) Pulse Rate:  [54-83] 54  (09/03 0622) Resp:  [15-20] 20  (09/03 0622) BP: (122-141)/(68-110) 122/70 mmHg (09/03 0622) SpO2:  [94 %-100 %] 98 % (09/03 0622) Weight:  [89 kg (196 lb 3.4 oz)] 89 kg (196 lb 3.4 oz) (09/03 0622)  Physical Examination: General:  Elderly , NAD  Neuro:  A/ox 3  HEENT: intact  Neck:  Supple Cardiovascular:  RRR Lungs:  Clear to auscultation bilaterally.  No wheezes or crackles Abdomen:  Soft, NABS Musculoskeletal:   1+ symmetric pretibial edema Skin:  No rash    Ct Chest Wo Contrast  04/14/2012  *RADIOLOGY REPORT*  Clinical Data: Left basilar airspace opacity on the portable chest radiograph earlier today.  Clinical concern for tuberculosis.  CT CHEST WITHOUT CONTRAST  Technique:  Multidetector CT imaging of the chest was performed following the standard protocol without IV contrast.  Comparison: Portable chest obtained earlier today.  Findings: Small bilateral pleural effusions.  Adjacent bilateral lower lobe atelectasis.  Otherwise, the lungs are clear.  5 mm probable calcified subpleural nodule in the posteromedial right upper lobe on image number 10.  Additional 4 mm noncalcified subpleural nodule in the posteromedial right upper lobe on image number 15.  No other lung masses and no enlarged lymph nodes.  Small amount of coronary artery calcification.  Right  ureteral stent on the frontal scout image.  Associated air in the right renal collecting system.  Thoracic spine degenerative changes, including changes of DISH.  IMPRESSION:  1.  Small bilateral pleural effusions. 2.  Mild bilateral lower lobe atelectasis. 3.  Two small, right upper lobe subpleural nodules, as described above.  These are most likely related to previous granulomatous infection.  This could be further evaluated with a follow-up chest CT without contrast in 1 year. 4.  No findings suspicious for tuberculosis infection.   Original Report Authenticated By: Darrol Angel, M.D.    Dg Chest Port 1 View  04/14/2012  *RADIOLOGY REPORT*  Clinical Data: Shortness of breath and pneumonia.  PORTABLE CHEST - 1 VIEW  Comparison: 1 day prior  Findings: Lower cervical spine fixation.  Right internal jugular line unchanged with tip at low SVC.  Midline trachea.  Normal heart size.  No right and no definite left pleural fluid. No pneumothorax.  Lung volumes are mildly low. Slight worsening left base air space disease.  Minimal right base atelectasis is similar.  IMPRESSION: Slight worsening in left base air space disease, suspicious for infection.   Original Report Authenticated By: Consuello Bossier, M.D.       ASSESSMENT  *Sepsis, etiology unclear. SIRS physiology resolved, PCT remains elevated  Suspect urinary tract source as most likely cause given findings on admission UA  Note: urine cx was performed several hours after abx administered. Hence negative urine cx  Cystitis due to intravesical BCG administration  Hypotension, resolved  Fever, resolved  Acute renal failure, Cr improving. Baseline Cr 0.6  Steroid-induced hyperglycemia  PLAN: Further ID eval/abx mgmt per ID service D/C IVFs D/C steroids No indication for SUP. D/C PPI I would think, based on lack of significant CT chest findings, that we can D/C respiratory isolation - will let ID decide No hx of DM - D/C CBGS and SSI Ambulating -  D/C SQ heparin Anticipate D/C home 9/4 if OK with ID service     Billy Fischer, MD ; [facility] service [place] [phone].  After 5:30 PM or weekends, call [phone]

## 2012-04-16 DIAGNOSIS — R7309 Other abnormal glucose: Secondary | ICD-10-CM

## 2012-04-16 DIAGNOSIS — E876 Hypokalemia: Secondary | ICD-10-CM

## 2012-04-16 LAB — CULTURE, BLOOD (ROUTINE X 2)
Culture: NO GROWTH
Culture: NO GROWTH

## 2012-04-16 LAB — BASIC METABOLIC PANEL
BUN: 24 mg/dL — ABNORMAL HIGH (ref 6–23)
CO2: 25 mEq/L (ref 19–32)
Chloride: 109 mEq/L (ref 96–112)
GFR calc Af Amer: 50 mL/min — ABNORMAL LOW (ref >90)
Potassium: 2.5 mEq/L — CL (ref 3.5–5.1)

## 2012-04-16 LAB — GLUCOSE, CAPILLARY: Glucose-Capillary: 136 mg/dL — ABNORMAL HIGH (ref 70–99)

## 2012-04-16 LAB — PROCALCITONIN: Procalcitonin: 7.65 ng/mL

## 2012-04-16 MED ORDER — POTASSIUM CHLORIDE CRYS ER 20 MEQ PO TBCR: 40.0000 meq | EXTENDED_RELEASE_TABLET | Freq: Two times a day (BID) | ORAL | Status: DC

## 2012-04-16 MED ORDER — POTASSIUM CHLORIDE CRYS ER 20 MEQ PO TBCR
40.0000 meq | EXTENDED_RELEASE_TABLET | Freq: Three times a day (TID) | ORAL | Status: DC
  Administered 2012-04-16 (×2): 40 meq via ORAL
  Filled 2012-04-16 (×3): qty 2

## 2012-04-16 MED ORDER — DSS 100 MG PO CAPS: 100.0000 mg | ORAL_CAPSULE | Freq: Two times a day (BID) | ORAL | Status: AC | PRN

## 2012-04-16 MED ORDER — AMOXICILLIN-POT CLAVULANATE 875-125 MG PO TABS: 1.0000 | ORAL_TABLET | Freq: Two times a day (BID) | ORAL | Status: AC

## 2012-04-16 MED ORDER — POTASSIUM CHLORIDE CRYS ER 20 MEQ PO TBCR: 40.0000 meq | EXTENDED_RELEASE_TABLET | Freq: Every day | ORAL | Status: DC

## 2012-04-16 NOTE — Progress Notes (Signed)
Patient discharged to home with wife.  Discharge teaching completed including follow up care,and medications. Verbalizes understanding with no further questions. VSS, no complaints of pain. Discharged per wheelchair with wife.

## 2012-04-16 NOTE — Discharge Summary (Signed)
Physician Discharge Summary  Patient ID: Frank Delacruz MRN: [id number] DOB/AGE: [birth year] 66 y.o.  Admit date: 04/11/2012 Discharge date: 04/16/2012    Discharge Diagnoses:  Principal Problem:  *Sepsis Active Problems:  Cystitis due to intravesical BCG administration  Hypotension  Fever  Acute renal failure  Steroid-induced hyperglycemia  Hypokalemia    Brief Summary: Frank Delacruz is a 66 y.o. 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  His last rx was 8/29.  On 8/30 he presented with fever, chills, SOB and weakness.  In ER he was hypotensive, febrile (103) and tachycardic and PCCM was asked to take over for IMTS.     Consults: Infectious disease   Lines/tubes: R IJ CVL 8/31>>>9/4  Microbiology/Sepsis markers: BCx2 8/30>>>neg to date  AFB 8/30>>>neg to date  Urine 8/31>>> neg   Significant Diagnostic Studies:  Ct Chest Wo Contrast  04/14/2012  *RADIOLOGY REPORT*  Clinical Data: Left basilar airspace opacity on the portable chest radiograph earlier today.  Clinical concern for tuberculosis.  CT CHEST WITHOUT CONTRAST  Technique:  Multidetector CT imaging of the chest was performed following the standard protocol without IV contrast.  Comparison: Portable chest obtained earlier today.  Findings: Small bilateral pleural effusions.  Adjacent bilateral lower lobe atelectasis.  Otherwise, the lungs are clear.  5 mm probable calcified subpleural nodule in the posteromedial right upper lobe on image number 10.  Additional 4 mm noncalcified subpleural nodule in the posteromedial right upper lobe on image number 15.  No other lung masses and no enlarged lymph nodes.  Small amount of coronary artery calcification.  Right ureteral stent on the frontal scout image.  Associated air in the right renal collecting system.  Thoracic spine degenerative changes, including changes of DISH.  IMPRESSION:   1.  Small bilateral pleural effusions. 2.  Mild bilateral lower lobe atelectasis. 3.  Two small, right upper lobe subpleural nodules, as described above.  These are most likely related to previous granulomatous infection.  This could be further evaluated with a follow-up chest CT without contrast in 1 year. 4.  No findings suspicious for tuberculosis infection.   Original Report Authenticated By: Darrol Angel, M.D.                                                                       Hospital Summary by Discharge Diagnosis  Sepsis  - with hypotension r/t cystitis v PNA.  BCG-osis was considered and is possible but very rare.  Pt was followed closely by ID.  See below.  Pulm infiltrates - PNA v BCG induced pneumonitis. Initially on airborn isolation.  Sputum checked for AFB, remains neg at this time.  Now off resp isolation with improvement infiltrates and CT scan not suggestive of active disease, likely old granulomatous disease.  At time of d/c on RA, tol well. No resp distress.  Treated as below with broad spectrum abx, as well as steroids for ?pneumonitis.  Now off steroids, tol well.   Cystitis -- all cultures remain neg to date.  Followed closely by ID, initially treated with Zosyn, vanc, linezolid, will d.c home on augmentin x 10 days. He will  f/u in ID clinic as well as f/u with urology as prev scheduled.  Hypokalemia - K remains low at 2.5.  Previously 2.7 on 9/2.  Scr improving.  No lasix given.  Will rx with K-dur x 3 today and d/c with daily K and f/u with PCP next week with chemistries.  Acute renal failure - In setting SIRS/sepsis.  Renal ultrasound neg.  UOP improved with foley/flomax. Scr trending back to normal.  Outpt pcp f/u with chem next week.  Steroids induced hyperglycemia - resolved.    Filed Vitals:   04/15/12 1342 04/15/12 2127 04/16/12 0550 04/16/12 0700  BP: 121/75 121/70 120/65   Pulse: 89 65 75   Temp: 98.4 F (36.9 C) 98.1 F (36.7 C) 98.3 F (36.8 C)     TempSrc: Oral Oral Oral   Resp: 18 20 18    Height:      Weight:    193 lb 12.6 oz (87.9 kg)  SpO2: 98% 97% 97%   on RA    Discharge Labs  BMET  Lab 04/16/12 0500 04/15/12 0500 04/14/12 0500 04/13/12 0451 04/12/12 1637 04/12/12 1540 04/12/12 0530 04/11/12 2336  NA 143 -- 141 137 -- 135 -- 137  K 2.5* -- 2.7* -- -- -- -- --  CL 109 -- 109 108 -- 106 -- 102  CO2 25 -- 22 22 -- 16* -- 24  GLUCOSE 97 -- 133* 288* -- 335* -- 164*  BUN 24* -- 29* 27* -- 21 -- 14  CREATININE 1.59* -- 1.73* 2.00* -- 2.33* 2.34* --  CALCIUM 8.1* -- 8.1* 7.9* -- 7.4* -- 8.9  MG -- 1.9 -- -- 1.2* -- 1.0* --  PHOS -- 3.0 -- -- -- -- -- --     CBC   Lab 04/14/12 0500 04/13/12 0451 04/12/12 1540  HGB 9.5* 9.8* 10.9*  HCT 29.2* 30.6* 35.4*  WBC 19.6* 28.4* 34.4*  PLT 170 162 184   Anti-Coagulation  Lab 04/12/12 0530  INR 1.30     Follow-up Information    Follow up with Josue Hector, MD on 04/22/2012. (1:30pm )    Contact information:   [address] [place] [address] [phone]       Follow up with Anner Crete, MD on 04/18/2012. (3:00pm)    Contact information:   [address] [place] [address] [phone]       Follow up with Acey Lav, MD on 04/25/2012. (10:15am )    Contact information:   [address] [place] [address] [phone]           Medication List  As of 04/16/2012 10:56 AM   START taking these medications         amoxicillin-clavulanate 875-125 MG per tablet   Commonly known as: AUGMENTIN   Take 1 tablet by mouth 2 (two) times daily.      DSS 100 MG Caps   Take 100 mg by mouth 2 (two) times daily as needed for constipation.      potassium chloride SA 20 MEQ tablet   Commonly known as: K-DUR,KLOR-CON   Take 2 tablets (40 mEq total) by mouth daily.         CONTINUE taking these medications         finasteride 5 MG tablet   Commonly known as:  PROSCAR      ondansetron 4 MG tablet   Commonly known as: ZOFRAN  Tamsulosin HCl 0.4 MG Caps   Commonly known as: FLOMAX      ZANTAC PO         STOP taking these medications         atenolol 50 MG tablet      NUCYNTA 50 MG Tabs          Where to get your medications    These are the prescriptions that you need to pick up. We sent them to a specific pharmacy, so you will need to go there to get them.   [id number] - [place], [place] - [address]    [address] [place] [address]    Phone: [phone]        amoxicillin-clavulanate 875-125 MG per tablet   potassium chloride SA 20 MEQ tablet         You may get these medications from any pharmacy.         DSS 100 MG Caps              Disposition: 01-Home or Self Care  Discharged Condition: Frank Delacruz has met maximum benefit of inpatient care and is medically stable and cleared for discharge.  Patient is pending follow up as above.      Time spent on disposition:  Greater than 35 minutes.   SignedDanford Bad, NP 04/16/2012  10:56 AM Pager: [phone] or [phone]     *Care during the described time interval was provided by me and/or other providers on the critical care team. I have reviewed this patient's available data, including medical history, events of note, physical examination and test results as part of my evaluation.   Billy Fischer, MD ; [facility] [phone].  After 5:30 PM or weekends, call [phone]

## 2012-04-18 ENCOUNTER — Ambulatory Visit: Payer: Medicare Other | Admitting: Urology

## 2012-04-25 ENCOUNTER — Inpatient Hospital Stay: Payer: Medicare Other | Admitting: Infectious Disease

## 2012-04-29 ENCOUNTER — Other Ambulatory Visit: Payer: Self-pay | Admitting: Infectious Disease

## 2012-04-29 ENCOUNTER — Telehealth: Payer: Self-pay | Admitting: Infectious Disease

## 2012-04-29 ENCOUNTER — Ambulatory Visit (INDEPENDENT_AMBULATORY_CARE_PROVIDER_SITE_OTHER): Payer: Medicare Other | Admitting: Urology

## 2012-04-29 DIAGNOSIS — C679 Malignant neoplasm of bladder, unspecified: Secondary | ICD-10-CM

## 2012-04-29 DIAGNOSIS — A319 Mycobacterial infection, unspecified: Secondary | ICD-10-CM | POA: Insufficient documentation

## 2012-04-29 DIAGNOSIS — C669 Malignant neoplasm of unspecified ureter: Secondary | ICD-10-CM

## 2012-04-29 MED ORDER — ETHAMBUTOL HCL 400 MG PO TABS: 1600.0000 mg | ORAL_TABLET | Freq: Every day | ORAL | Status: DC

## 2012-04-29 MED ORDER — ISONIAZID 300 MG PO TABS: 300.0000 mg | ORAL_TABLET | Freq: Every day | ORAL | Status: DC

## 2012-04-29 MED ORDER — RIFAMPIN 300 MG PO CAPS: 600.0000 mg | ORAL_CAPSULE | Freq: Every day | ORAL | Status: DC

## 2012-04-29 NOTE — Telephone Encounter (Signed)
Patient with AFB  Culture positive. He had received BCG therapy and become critically ill. He was being worked up for disseminated BCG therapy and now sputum is growing AFB positive organism.  His wife also has suffered a recent respiratory illness that required hospitalization at [facility] as well. I do not believe that she was worked up for infection with Mycobacterium bovis.  I will call in medications for M. bovis tomorrow and also arrange for the patient to be seen in my, clinic. Have it called the patient and his wife and advised of a not leave their house without wearing masks. I'll be in touch with infection prevention here at [facility] as well as the health department. Transmission of them bovis in the community and in hospitals  is a rare event but it is a transmissible infectious disease.

## 2012-04-29 NOTE — Telephone Encounter (Signed)
Patient needs to be started on   INH 300 mg daily Rifampin 600mg  daily  Ethambutol 1600mg  daily  Will call in to drug store.? If [facility] might also provide drugs pending ID of org as M bovis from BCG vaccination.

## 2012-04-30 ENCOUNTER — Other Ambulatory Visit: Payer: Self-pay | Admitting: Infectious Disease

## 2012-04-30 NOTE — Progress Notes (Signed)
Per Dr. Daiva Eves discontinue ethambutol.  Place other 2 medications on hold.  [place] is in conversation with Dr. Daiva Eves about treatment and awaiting culture results from sputum sample.

## 2012-05-06 ENCOUNTER — Encounter: Payer: Self-pay | Admitting: Infectious Disease

## 2012-05-06 ENCOUNTER — Ambulatory Visit (INDEPENDENT_AMBULATORY_CARE_PROVIDER_SITE_OTHER): Payer: Medicare Other | Admitting: Infectious Disease

## 2012-05-06 VITALS — BP 142/95 | HR 101 | Temp 98.3°F | Wt 183.0 lb

## 2012-05-06 DIAGNOSIS — N3080 Other cystitis without hematuria: Secondary | ICD-10-CM

## 2012-05-06 DIAGNOSIS — A319 Mycobacterial infection, unspecified: Secondary | ICD-10-CM

## 2012-05-06 NOTE — Assessment & Plan Note (Signed)
The patient will need 9 months of isoniazid rifampin. Once we know this is truly a Mycobacterium bovis species the health Department no longer belt which will provide the patient with anti-biotics and he'll need to take isoniazid rifampin prescribed by myself.

## 2012-05-06 NOTE — Assessment & Plan Note (Signed)
See above discussion. AFB cultures and urine are also still intubating. Note the patient was on levofloxacin and isoniazid prior to obtaining cultures from blood urine and sputum.

## 2012-05-06 NOTE — Progress Notes (Signed)
Subjective:    Patient ID: Frank Delacruz, male    DOB: 02/15/1946, 66 y.o.   MRN: [id number]  HPI  66 y.o. male. 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 Retta Diones.  He received 6 cycles last year withour problems per his account, though his wife believes he may have been ill between treatments.  During his 2nd most recent rx he became febrile, ills and weak and ultimately was dx with UTI and rx with abx with delay in most recent instillation of BCG.  He received last rx on 04/12/12 and became febrile with shaking rigors, SOB weakness and presyncope. His wife called [phone] at his request and he was brought to the ED where he wasfound to be hypotensive, febrile to 103 and tachycardic, CXR portable showed a possible LLL PNA. He was admitted by [facility] and then to [facility]. He responded well to broad spectrum abx (vancomycin, levaquin and zosyn along with Isoniazid, fluid rescusitation and pressors. I saw the patient and obtained sputum cultures blood cultures and urine cultures for AFB. Sputum culture has subtotally yielded a Mycobacterium species. Formal identification is yet to have been made. I did contact the health department have been initiating therapy for the patient with isoniazid and rifampin with directly observed therapy during the week and the patient taking his medicines over the weekend. Patient's wife had in the interim been admitted to [facility] with cough dyspnea and diagnosed with possible COPD exacerbation and was treated with corticosteroids and levofloxacin. She has had sputum obtained for her as well to rule out Mycobacterium bovis infection. I spent greater than 45 minutes with the patient including greater than 50% of time in face to face counsel of the patient and in coordination of their care with [facility], local and state labs.    Review of Systems  Constitutional: Negative for  fever, chills, diaphoresis, activity change, appetite change, fatigue and unexpected weight change.  HENT: Negative for congestion, sore throat, rhinorrhea, sneezing, trouble swallowing and sinus pressure.   Eyes: Negative for photophobia and visual disturbance.  Respiratory: Negative for cough, chest tightness, shortness of breath, wheezing and stridor.   Cardiovascular: Negative for chest pain, palpitations and leg swelling.  Gastrointestinal: Negative for nausea, vomiting, abdominal pain, diarrhea, constipation, blood in stool, abdominal distention and anal bleeding.  Genitourinary: Negative for dysuria, hematuria, flank pain and difficulty urinating.  Musculoskeletal: Negative for myalgias, back pain, joint swelling, arthralgias and gait problem.  Skin: Negative for color change, pallor, rash and wound.  Neurological: Negative for dizziness, tremors, weakness and light-headedness.  Hematological: Negative for adenopathy. Does not bruise/bleed easily.  Psychiatric/Behavioral: Negative for behavioral problems, confusion, disturbed wake/sleep cycle, dysphoric mood, decreased concentration and agitation.       Objective:   Physical Exam  Constitutional: He is oriented to person, place, and time. He appears well-developed. No distress.  HENT:  Head: Normocephalic and atraumatic.  Mouth/Throat: Oropharynx is clear and moist. No oropharyngeal exudate.  Eyes: EOM are normal.  Neck: Normal range of motion. Neck supple.  Cardiovascular: Normal rate, regular rhythm and normal heart sounds.  Exam reveals no gallop and no friction rub.   No murmur heard. Pulmonary/Chest: Effort normal and breath sounds normal. No respiratory distress. He has no wheezes. He has no rales.  Abdominal: He exhibits no distension and no mass. There is no tenderness. There is no rebound and no guarding.  Musculoskeletal: He exhibits no  edema and no tenderness.  Neurological: He is alert and oriented to person, place, and  time. He has normal reflexes. He exhibits normal muscle tone. Coordination normal.  Skin: Skin is warm and dry. He is not diaphoretic. No erythema. No pallor.  Psychiatric: He has a normal mood and affect. His behavior is normal. Judgment and thought content normal.          Assessment & Plan:  Mycobacterium infection, non-TB The patient will need 9 months of isoniazid rifampin. Once we know this is truly a Mycobacterium bovis species the health Department no longer belt which will provide the patient with anti-biotics and he'll need to take isoniazid rifampin prescribed by myself.  Cystitis due to intravesical BCG administration See above discussion. AFB cultures and urine are also still intubating. Note the patient was on levofloxacin and isoniazid prior to obtaining cultures from blood urine and sputum.

## 2012-05-21 ENCOUNTER — Telehealth: Payer: Self-pay

## 2012-05-21 NOTE — Telephone Encounter (Signed)
Pt is having problems voiding.    Two days ago he had a rash and medications were held for two days, rash cleared.  Has the diagnosis been confirmed for mycobacterium bovis?  Are cultures back?  Pt has not been symptomatic and has not been coughing.   He has been on medication for 8 weeks.   [facility]  [phone]  Please advise.  Laurell Josephs, RN

## 2012-05-22 ENCOUNTER — Telehealth: Payer: Self-pay | Admitting: Infectious Disease

## 2012-05-22 NOTE — Telephone Encounter (Signed)
Spoke with [employer] from [facility] and pt has had urinary retention that he attributes to his INH, RIF along with fine rash (likely due to RIF).   Genevie Cheshire will contact Dr. Valentina Lucks at [facility] and [facility] for recs. My thought would be if this is truly due to meds tova change pt to alternative regimen such as avelox and ETH.  I have never heard of this urinary retetion occurring with these drugs. I

## 2012-05-23 ENCOUNTER — Telehealth: Payer: Self-pay | Admitting: Infectious Disease

## 2012-05-23 NOTE — Telephone Encounter (Signed)
Pt's sputum grew M abscessus in one of 2 cultures. She does therefore NOT with only 1/2 cultures positive and lack of symptoms, unimpressive CXR leadme to idea that he DOES NOT clearly have infection.  Therefore STOP all antibiotics  Fu with me  HD RN called Antony Madura  And callrf pt to tell him to stop the meds as well.

## 2012-05-27 LAB — AFB CULTURE, BLOOD

## 2012-06-03 ENCOUNTER — Telehealth: Payer: Self-pay | Admitting: Infectious Disease

## 2012-06-03 DIAGNOSIS — A319 Mycobacterial infection, unspecified: Secondary | ICD-10-CM

## 2012-06-03 LAB — AFB CULTURE WITH SMEAR (NOT AT ARMC): Acid Fast Smear: NONE SEEN

## 2012-06-03 NOTE — Telephone Encounter (Signed)
Tamika does this man have a followup appt with [place]? He is growing M abscessus from 2 sputum cultures. I also want lab to run sensitivities on his M abscessus

## 2012-06-04 NOTE — Telephone Encounter (Signed)
That is probably OK 

## 2012-06-04 NOTE — Telephone Encounter (Signed)
His scheduled f/u is 07/23/2012. Would you like me to reschedule him for something sooner? I will ask the lab to order sensitivities.

## 2012-06-06 ENCOUNTER — Other Ambulatory Visit: Payer: Self-pay | Admitting: Infectious Disease

## 2012-07-08 ENCOUNTER — Ambulatory Visit (INDEPENDENT_AMBULATORY_CARE_PROVIDER_SITE_OTHER): Payer: Medicare Other | Admitting: Urology

## 2012-07-08 DIAGNOSIS — C669 Malignant neoplasm of unspecified ureter: Secondary | ICD-10-CM

## 2012-07-08 DIAGNOSIS — C679 Malignant neoplasm of bladder, unspecified: Secondary | ICD-10-CM

## 2012-07-08 DIAGNOSIS — N400 Enlarged prostate without lower urinary tract symptoms: Secondary | ICD-10-CM

## 2012-07-15 ENCOUNTER — Ambulatory Visit (INDEPENDENT_AMBULATORY_CARE_PROVIDER_SITE_OTHER): Payer: Medicare Other | Admitting: Urology

## 2012-07-15 DIAGNOSIS — C679 Malignant neoplasm of bladder, unspecified: Secondary | ICD-10-CM

## 2012-07-15 DIAGNOSIS — C669 Malignant neoplasm of unspecified ureter: Secondary | ICD-10-CM

## 2012-07-15 DIAGNOSIS — N390 Urinary tract infection, site not specified: Secondary | ICD-10-CM

## 2012-07-16 ENCOUNTER — Other Ambulatory Visit: Payer: Self-pay | Admitting: Urology

## 2012-07-22 ENCOUNTER — Encounter (HOSPITAL_BASED_OUTPATIENT_CLINIC_OR_DEPARTMENT_OTHER): Payer: Self-pay | Admitting: *Deleted

## 2012-07-23 ENCOUNTER — Ambulatory Visit: Payer: Medicare Other | Admitting: Infectious Disease

## 2012-07-23 ENCOUNTER — Encounter (HOSPITAL_BASED_OUTPATIENT_CLINIC_OR_DEPARTMENT_OTHER): Payer: Self-pay | Admitting: *Deleted

## 2012-07-23 LAB — AFB CULTURE WITH SMEAR (NOT AT ARMC)

## 2012-07-23 NOTE — Progress Notes (Signed)
SPOKE W/ WIFE. NPO AFTER MN WITH EXCEPTION WATER/ GATORADE AND NO CHEW TOBACCO UNTIL 0730. ARRIVES AT 1230. NEEDS ISTAT. CURRENT EKG IN EPIC AND CHART. WILL TAKE ATIVAN, ZANTAC, ATENOLOL, AND FINASTERIDE AM OF SURG W/ SIPS OF WATER.

## 2012-07-25 ENCOUNTER — Encounter (HOSPITAL_BASED_OUTPATIENT_CLINIC_OR_DEPARTMENT_OTHER): Payer: Self-pay | Admitting: Anesthesiology

## 2012-07-25 ENCOUNTER — Ambulatory Visit (HOSPITAL_BASED_OUTPATIENT_CLINIC_OR_DEPARTMENT_OTHER): Payer: Medicare Other | Admitting: Anesthesiology

## 2012-07-25 ENCOUNTER — Ambulatory Visit (HOSPITAL_BASED_OUTPATIENT_CLINIC_OR_DEPARTMENT_OTHER)
Admission: RE | Admit: 2012-07-25 | Discharge: 2012-07-25 | Disposition: A | Discharge status: Home / Self Care | Payer: Medicare Other | Source: Ambulatory Visit | Attending: Urology | Admitting: Urology

## 2012-07-25 ENCOUNTER — Encounter (HOSPITAL_BASED_OUTPATIENT_CLINIC_OR_DEPARTMENT_OTHER)
Admission: RE | Disposition: A | Discharge status: Home / Self Care | Payer: Self-pay | Source: Ambulatory Visit | Attending: Urology

## 2012-07-25 ENCOUNTER — Encounter (HOSPITAL_BASED_OUTPATIENT_CLINIC_OR_DEPARTMENT_OTHER): Payer: Self-pay | Admitting: *Deleted

## 2012-07-25 DIAGNOSIS — Z538 Procedure and treatment not carried out for other reasons: Secondary | ICD-10-CM | POA: Insufficient documentation

## 2012-07-25 DIAGNOSIS — D494 Neoplasm of unspecified behavior of bladder: Secondary | ICD-10-CM | POA: Insufficient documentation

## 2012-07-25 DIAGNOSIS — I10 Essential (primary) hypertension: Secondary | ICD-10-CM | POA: Insufficient documentation

## 2012-07-25 DIAGNOSIS — K219 Gastro-esophageal reflux disease without esophagitis: Secondary | ICD-10-CM | POA: Insufficient documentation

## 2012-07-25 HISTORY — DX: Personal history of other diseases of urinary system: Z87.448

## 2012-07-25 HISTORY — DX: Retention of urine, unspecified: R33.9

## 2012-07-25 HISTORY — DX: Benign prostatic hyperplasia without lower urinary tract symptoms: N40.0

## 2012-07-25 HISTORY — DX: Dysuria: R30.0

## 2012-07-25 SURGERY — TURBT (TRANSURETHRAL RESECTION OF BLADDER TUMOR)
Anesthesia: General

## 2012-07-25 MED ORDER — LACTATED RINGERS IV SOLN
INTRAVENOUS | Status: DC
Start: 2012-07-25 — End: 2012-07-25
  Filled 2012-07-25: qty 1000

## 2012-07-25 MED ORDER — CEFAZOLIN SODIUM-DEXTROSE 2-3 GM-% IV SOLR
2.0000 g | INTRAVENOUS | Status: DC
  Filled 2012-07-25: qty 50

## 2012-07-25 MED ORDER — CEFAZOLIN SODIUM 1-5 GM-% IV SOLN
1.0000 g | INTRAVENOUS | Status: DC
  Filled 2012-07-25: qty 50

## 2012-07-25 SURGICAL SUPPLY — 41 items
ADAPTER CATH URET PLST 4-6FR (CATHETERS) IMPLANT
ADPR CATH URET STRL DISP 4-6FR (CATHETERS)
BAG DRAIN URO-CYSTO SKYTR STRL (DRAIN) ×1 IMPLANT
BAG DRN ANRFLXCHMBR STRAP LEK (BAG)
BAG DRN UROCATH (DRAIN)
BAG URINE DRAINAGE (UROLOGICAL SUPPLIES) IMPLANT
BAG URINE LEG 19OZ MD ST LTX (BAG) IMPLANT
BASKET ZERO TIP NITINOL 2.4FR (BASKET) IMPLANT
BSKT STON RTRVL ZERO TP 2.4FR (BASKET)
CANISTER SUCT LVC 12 LTR MEDI- (MISCELLANEOUS) IMPLANT
CATH FOLEY 2WAY SLVR  5CC 20FR (CATHETERS)
CATH FOLEY 2WAY SLVR  5CC 22FR (CATHETERS)
CATH FOLEY 2WAY SLVR 5CC 20FR (CATHETERS) IMPLANT
CATH FOLEY 2WAY SLVR 5CC 22FR (CATHETERS) IMPLANT
CATH INTERMIT  6FR 70CM (CATHETERS) IMPLANT
CLOTH BEACON ORANGE TIMEOUT ST (SAFETY) ×1 IMPLANT
DRAPE CAMERA CLOSED 9X96 (DRAPES) ×1 IMPLANT
ELECT BUTTON BIOP 24F 90D PLAS (MISCELLANEOUS) IMPLANT
ELECT LOOP HF 26F 30D .35MM (CUTTING LOOP) IMPLANT
ELECT NEEDLE 45D HF 24-28F 12D (CUTTING LOOP) IMPLANT
ELECT REM PT RETURN 9FT ADLT (ELECTROSURGICAL)
ELECTRODE REM PT RTRN 9FT ADLT (ELECTROSURGICAL) ×1 IMPLANT
EVACUATOR MICROVAS BLADDER (UROLOGICAL SUPPLIES) IMPLANT
GLOVE BIO SURGEON STRL SZ8 (GLOVE) ×1 IMPLANT
GOWN PREVENTION PLUS LG XLONG (DISPOSABLE) ×1 IMPLANT
GOWN STRL REIN XL XLG (GOWN DISPOSABLE) IMPLANT
GOWN XL W/COTTON TOWEL STD (GOWNS) ×1 IMPLANT
GUIDEWIRE 0.038 PTFE COATED (WIRE) IMPLANT
GUIDEWIRE ANG ZIPWIRE 038X150 (WIRE) IMPLANT
GUIDEWIRE STR DUAL SENSOR (WIRE) IMPLANT
HOLDER FOLEY CATH W/STRAP (MISCELLANEOUS) IMPLANT
IV NS IRRIG 3000ML ARTHROMATIC (IV SOLUTION) ×2 IMPLANT
KIT ASPIRATION TUBING (SET/KITS/TRAYS/PACK) IMPLANT
LASER FIBER DISP (UROLOGICAL SUPPLIES) IMPLANT
LOOP CUTTING 24FR OLYMPUS (CUTTING LOOP) IMPLANT
LOOP ELECTRODE 28FR (MISCELLANEOUS) IMPLANT
NS IRRIG 500ML POUR BTL (IV SOLUTION) IMPLANT
PACK CYSTOSCOPY (CUSTOM PROCEDURE TRAY) ×1 IMPLANT
PLUG CATH AND CAP STER (CATHETERS) IMPLANT
SET ASPIRATION TUBING (TUBING) IMPLANT
SYRINGE IRR TOOMEY STRL 70CC (SYRINGE) IMPLANT

## 2012-07-25 NOTE — Anesthesia Preprocedure Evaluation (Deleted)
Anesthesia Evaluation  Patient identified by MRN, date of birth, ID band Patient awake  General Assessment Comment:Glc 457, case cancelled  Reviewed: Allergy & Precautions, H&P , NPO status , Patient's Chart, lab work & pertinent test results  Airway Mallampati: II TM Distance: >3 FB Neck ROM: Full    Dental No notable dental hx.    Pulmonary neg pulmonary ROS,  breath sounds clear to auscultation  Pulmonary exam normal       Cardiovascular hypertension, Pt. on medications Rhythm:Regular Rate:Normal     Neuro/Psych negative neurological ROS  negative psych ROS   GI/Hepatic Neg liver ROS, GERD-  Medicated,  Endo/Other  negative endocrine ROS  Renal/GU negative Renal ROS  negative genitourinary   Musculoskeletal negative musculoskeletal ROS (+)   Abdominal   Peds negative pediatric ROS (+)  Hematology negative hematology ROS (+)   Anesthesia Other Findings   Reproductive/Obstetrics negative OB ROS                          Anesthesia Physical Anesthesia Plan  ASA: II  Anesthesia Plan: General   Post-op Pain Management:    Induction: Intravenous  Airway Management Planned: LMA  Additional Equipment:   Intra-op Plan:   Post-operative Plan:   Informed Consent: I have reviewed the patients History and Physical, chart, labs and discussed the procedure including the risks, benefits and alternatives for the proposed anesthesia with the patient or authorized representative who has indicated his/her understanding and acceptance.   Dental advisory given  Plan Discussed with: CRNA and Surgeon  Anesthesia Plan Comments: (5 LMA used previously)        Anesthesia Quick Evaluation

## 2012-07-25 NOTE — Progress Notes (Signed)
Pt and his wife advised to make an appointment with primary care physician for early next week.  Dr Okey Dupre has spoken with a physician in that office to alert him to need for an early appt.  Pt advised to refrain from sweets and sugared drinks in the meantime.

## 2012-08-07 LAB — REFERRED ASSAY

## 2012-08-21 ENCOUNTER — Other Ambulatory Visit: Payer: Self-pay | Admitting: Urology

## 2012-08-25 ENCOUNTER — Encounter: Payer: Self-pay | Admitting: Infectious Disease

## 2012-09-01 ENCOUNTER — Encounter (HOSPITAL_BASED_OUTPATIENT_CLINIC_OR_DEPARTMENT_OTHER): Payer: Self-pay | Admitting: *Deleted

## 2012-09-01 NOTE — Progress Notes (Addendum)
To [facility] at 1000-Istat on arrival,Ekg in epic- Npo after mn-will take atenolol  with sip of water that am-instructed to bring his diabetic medication so can complete his list.Reminded to refrain from chewing tobacco the evening prior his procedure.

## 2012-09-05 ENCOUNTER — Observation Stay (HOSPITAL_BASED_OUTPATIENT_CLINIC_OR_DEPARTMENT_OTHER)
Admission: RE | Admit: 2012-09-05 | Discharge: 2012-09-06 | Disposition: A | Discharge status: Home / Self Care | Payer: Medicare Other | Source: Ambulatory Visit | Attending: Urology | Admitting: Urology

## 2012-09-05 ENCOUNTER — Encounter (HOSPITAL_BASED_OUTPATIENT_CLINIC_OR_DEPARTMENT_OTHER): Payer: Self-pay | Admitting: Anesthesiology

## 2012-09-05 ENCOUNTER — Encounter (HOSPITAL_COMMUNITY)
Admission: RE | Disposition: A | Discharge status: Home / Self Care | Payer: Self-pay | Source: Ambulatory Visit | Attending: Urology

## 2012-09-05 ENCOUNTER — Encounter (HOSPITAL_BASED_OUTPATIENT_CLINIC_OR_DEPARTMENT_OTHER): Payer: Self-pay | Admitting: *Deleted

## 2012-09-05 ENCOUNTER — Ambulatory Visit (HOSPITAL_BASED_OUTPATIENT_CLINIC_OR_DEPARTMENT_OTHER): Payer: Medicare Other | Admitting: Anesthesiology

## 2012-09-05 DIAGNOSIS — I10 Essential (primary) hypertension: Secondary | ICD-10-CM | POA: Insufficient documentation

## 2012-09-05 DIAGNOSIS — C675 Malignant neoplasm of bladder neck: Principal | ICD-10-CM | POA: Insufficient documentation

## 2012-09-05 DIAGNOSIS — C679 Malignant neoplasm of bladder, unspecified: Secondary | ICD-10-CM

## 2012-09-05 DIAGNOSIS — N401 Enlarged prostate with lower urinary tract symptoms: Secondary | ICD-10-CM | POA: Insufficient documentation

## 2012-09-05 DIAGNOSIS — E785 Hyperlipidemia, unspecified: Secondary | ICD-10-CM | POA: Insufficient documentation

## 2012-09-05 DIAGNOSIS — K219 Gastro-esophageal reflux disease without esophagitis: Secondary | ICD-10-CM | POA: Insufficient documentation

## 2012-09-05 DIAGNOSIS — N138 Other obstructive and reflux uropathy: Secondary | ICD-10-CM | POA: Insufficient documentation

## 2012-09-05 DIAGNOSIS — C669 Malignant neoplasm of unspecified ureter: Secondary | ICD-10-CM | POA: Insufficient documentation

## 2012-09-05 DIAGNOSIS — E119 Type 2 diabetes mellitus without complications: Secondary | ICD-10-CM | POA: Insufficient documentation

## 2012-09-05 HISTORY — PX: TRANSURETHRAL RESECTION OF BLADDER TUMOR: SHX2575

## 2012-09-05 HISTORY — PX: CYSTOSCOPY W/ RETROGRADES: SHX1426

## 2012-09-05 HISTORY — PX: TRANSURETHRAL INCISION OF PROSTATE: SHX2573

## 2012-09-05 HISTORY — DX: Type 2 diabetes mellitus without complications: E11.9

## 2012-09-05 LAB — POCT I-STAT, CHEM 8
BUN: 21 mg/dL (ref 6–23)
Calcium, Ion: 1.35 mmol/L — ABNORMAL HIGH (ref 1.13–1.30)
Creatinine, Ser: 1.5 mg/dL — ABNORMAL HIGH (ref 0.50–1.35)
Hemoglobin: 14.3 g/dL (ref 13.0–17.0)
TCO2: 30 mmol/L (ref 0–100)

## 2012-09-05 SURGERY — TURBT (TRANSURETHRAL RESECTION OF BLADDER TUMOR)
Anesthesia: General | Site: Ureter | Wound class: Clean Contaminated

## 2012-09-05 MED ORDER — LORAZEPAM 0.5 MG PO TABS
0.5000 mg | ORAL_TABLET | Freq: Three times a day (TID) | ORAL | Status: DC
  Administered 2012-09-05 – 2012-09-06 (×2): 0.5 mg via ORAL
  Filled 2012-09-05 (×2): qty 1

## 2012-09-05 MED ORDER — CIPROFLOXACIN IN D5W 400 MG/200ML IV SOLN
400.0000 mg | Freq: Two times a day (BID) | INTRAVENOUS | Status: AC
  Administered 2012-09-05: 400 mg via INTRAVENOUS
  Filled 2012-09-05: qty 200

## 2012-09-05 MED ORDER — ATENOLOL 50 MG PO TABS
50.0000 mg | ORAL_TABLET | Freq: Every morning | ORAL | Status: DC
  Administered 2012-09-06: 50 mg via ORAL
  Filled 2012-09-05: qty 1

## 2012-09-05 MED ORDER — LIDOCAINE HCL (CARDIAC) 20 MG/ML IV SOLN
INTRAVENOUS | Status: DC | PRN
  Administered 2012-09-05: 80 mg via INTRAVENOUS

## 2012-09-05 MED ORDER — GLYCOPYRROLATE 0.2 MG/ML IJ SOLN
INTRAMUSCULAR | Status: DC | PRN
  Administered 2012-09-05: 0.4 mg via INTRAVENOUS

## 2012-09-05 MED ORDER — SODIUM CHLORIDE 0.45 % IV SOLN
INTRAVENOUS | Status: DC
  Administered 2012-09-05: 14:00:00 via INTRAVENOUS

## 2012-09-05 MED ORDER — EPHEDRINE SULFATE 50 MG/ML IJ SOLN
INTRAMUSCULAR | Status: DC | PRN
  Administered 2012-09-05: 10 mg via INTRAVENOUS
  Administered 2012-09-05: 15 mg via INTRAVENOUS
  Administered 2012-09-05: 10 mg via INTRAVENOUS

## 2012-09-05 MED ORDER — METFORMIN HCL 500 MG PO TABS: 1000.0000 mg | ORAL_TABLET | Freq: Two times a day (BID) | ORAL | Status: DC

## 2012-09-05 MED ORDER — PROPOFOL 10 MG/ML IV BOLUS
INTRAVENOUS | Status: DC | PRN
  Administered 2012-09-05: 200 mg via INTRAVENOUS

## 2012-09-05 MED ORDER — LACTATED RINGERS IV SOLN
INTRAVENOUS | Status: DC
  Administered 2012-09-05 (×3): via INTRAVENOUS
  Filled 2012-09-05: qty 1000

## 2012-09-05 MED ORDER — PROMETHAZINE HCL 25 MG/ML IJ SOLN
6.2500 mg | INTRAMUSCULAR | Status: DC | PRN
  Filled 2012-09-05: qty 1

## 2012-09-05 MED ORDER — ONDANSETRON HCL 4 MG/2ML IJ SOLN
INTRAMUSCULAR | Status: DC | PRN
  Administered 2012-09-05: 4 mg via INTRAVENOUS

## 2012-09-05 MED ORDER — GLIPIZIDE ER 10 MG PO TB24
10.0000 mg | ORAL_TABLET | Freq: Every day | ORAL | Status: DC
  Administered 2012-09-06: 10 mg via ORAL
  Filled 2012-09-05 (×2): qty 1

## 2012-09-05 MED ORDER — POTASSIUM CHLORIDE CRYS ER 20 MEQ PO TBCR: 40.0000 meq | EXTENDED_RELEASE_TABLET | ORAL | Status: DC

## 2012-09-05 MED ORDER — ONDANSETRON HCL 4 MG/2ML IJ SOLN
4.0000 mg | INTRAMUSCULAR | Status: DC | PRN
  Administered 2012-09-05: 4 mg via INTRAVENOUS
  Filled 2012-09-05: qty 2

## 2012-09-05 MED ORDER — CEFAZOLIN SODIUM 1-5 GM-% IV SOLN
1.0000 g | INTRAVENOUS | Status: DC
  Filled 2012-09-05: qty 50

## 2012-09-05 MED ORDER — BELLADONNA ALKALOIDS-OPIUM 16.2-60 MG RE SUPP
RECTAL | Status: DC | PRN
  Administered 2012-09-05: 1 via RECTAL

## 2012-09-05 MED ORDER — CEFAZOLIN SODIUM-DEXTROSE 2-3 GM-% IV SOLR
2.0000 g | INTRAVENOUS | Status: AC
  Administered 2012-09-05: 2 g via INTRAVENOUS
  Filled 2012-09-05: qty 50

## 2012-09-05 MED ORDER — SODIUM CHLORIDE 0.9 % IR SOLN
Status: DC | PRN
  Administered 2012-09-05: 6000 mL via INTRAVESICAL

## 2012-09-05 MED ORDER — IOHEXOL 350 MG/ML SOLN
INTRAVENOUS | Status: DC | PRN
  Administered 2012-09-05: 50 mL via INTRAVENOUS

## 2012-09-05 MED ORDER — DOCUSATE SODIUM 100 MG PO CAPS
100.0000 mg | ORAL_CAPSULE | Freq: Two times a day (BID) | ORAL | Status: DC
  Administered 2012-09-05 – 2012-09-06 (×2): 100 mg via ORAL
  Filled 2012-09-05 (×3): qty 1

## 2012-09-05 MED ORDER — FENTANYL CITRATE 0.05 MG/ML IJ SOLN
25.0000 ug | INTRAMUSCULAR | Status: DC | PRN
  Filled 2012-09-05: qty 1

## 2012-09-05 MED ORDER — FENTANYL CITRATE 0.05 MG/ML IJ SOLN
INTRAMUSCULAR | Status: DC | PRN
  Administered 2012-09-05 (×3): 25 ug via INTRAVENOUS
  Administered 2012-09-05: 50 ug via INTRAVENOUS
  Administered 2012-09-05: 25 ug via INTRAVENOUS
  Administered 2012-09-05: 50 ug via INTRAVENOUS
  Administered 2012-09-05: 25 ug via INTRAVENOUS

## 2012-09-05 MED ORDER — FINASTERIDE 5 MG PO TABS
5.0000 mg | ORAL_TABLET | Freq: Every morning | ORAL | Status: DC
  Administered 2012-09-05 – 2012-09-06 (×2): 5 mg via ORAL
  Filled 2012-09-05 (×3): qty 1

## 2012-09-05 MED ORDER — STERILE WATER FOR IRRIGATION IR SOLN
Status: DC | PRN
  Administered 2012-09-05: 3000 mL

## 2012-09-05 MED ORDER — HYDROCODONE-ACETAMINOPHEN 5-325 MG PO TABS
1.0000 | ORAL_TABLET | ORAL | Status: DC | PRN
  Administered 2012-09-05: 2 via ORAL
  Administered 2012-09-06 (×2): 1 via ORAL
  Administered 2012-09-06: 2 via ORAL
  Filled 2012-09-05 (×2): qty 1
  Filled 2012-09-05 (×2): qty 2

## 2012-09-05 MED ORDER — FAMOTIDINE 10 MG PO TABS
10.0000 mg | ORAL_TABLET | Freq: Every day | ORAL | Status: DC | PRN
  Filled 2012-09-05: qty 1

## 2012-09-05 SURGICAL SUPPLY — 43 items
ADAPTER CATH URET PLST 4-6FR (CATHETERS) ×3 IMPLANT
ADPR CATH URET STRL DISP 4-6FR (CATHETERS) ×4
BAG DRAIN URO-CYSTO SKYTR STRL (DRAIN) ×5 IMPLANT
BAG DRN ANRFLXCHMBR STRAP LEK (BAG)
BAG DRN UROCATH (DRAIN) ×4
BAG URINE DRAINAGE (UROLOGICAL SUPPLIES) ×3 IMPLANT
BAG URINE LEG 19OZ MD ST LTX (BAG) IMPLANT
CANISTER SUCT LVC 12 LTR MEDI- (MISCELLANEOUS) ×3 IMPLANT
CATH FOLEY 2WAY SLVR  5CC 20FR (CATHETERS)
CATH FOLEY 2WAY SLVR  5CC 22FR (CATHETERS)
CATH FOLEY 2WAY SLVR 5CC 20FR (CATHETERS) IMPLANT
CATH FOLEY 2WAY SLVR 5CC 22FR (CATHETERS) IMPLANT
CATH FOLEY 3WAY 30CC 22F (CATHETERS) ×3 IMPLANT
CATH INTERMIT  6FR 70CM (CATHETERS) ×3 IMPLANT
CLOTH BEACON ORANGE TIMEOUT ST (SAFETY) ×5 IMPLANT
CONT SPECI 4OZ STER CLIK (MISCELLANEOUS) ×9 IMPLANT
DRAPE CAMERA CLOSED 9X96 (DRAPES) ×5 IMPLANT
ELECT BUTTON BIOP 24F 90D PLAS (MISCELLANEOUS) ×2 IMPLANT
ELECT LOOP HF 26F 30D .35MM (CUTTING LOOP) IMPLANT
ELECT NEEDLE 45D HF 24-28F 12D (CUTTING LOOP) IMPLANT
ELECT REM PT RETURN 9FT ADLT (ELECTROSURGICAL) ×5
ELECTRODE REM PT RTRN 9FT ADLT (ELECTROSURGICAL) ×4 IMPLANT
EVACUATOR MICROVAS BLADDER (UROLOGICAL SUPPLIES) IMPLANT
GLOVE BIO SURGEON STRL SZ8 (GLOVE) ×5 IMPLANT
GLOVE INDICATOR 6.5 STRL GRN (GLOVE) ×4 IMPLANT
GOWN PREVENTION PLUS LG XLONG (DISPOSABLE) ×5 IMPLANT
GOWN STRL REIN XL XLG (GOWN DISPOSABLE) ×5 IMPLANT
GOWN SURGICAL LARGE (GOWNS) ×2 IMPLANT
GOWN XL W/COTTON TOWEL STD (GOWNS) ×5 IMPLANT
GUIDEWIRE 0.038 PTFE COATED (WIRE) IMPLANT
GUIDEWIRE ANG ZIPWIRE 038X150 (WIRE) ×2 IMPLANT
GUIDEWIRE STR DUAL SENSOR (WIRE) ×4 IMPLANT
HOLDER FOLEY CATH W/STRAP (MISCELLANEOUS) ×2 IMPLANT
IV NS IRRIG 3000ML ARTHROMATIC (IV SOLUTION) ×9 IMPLANT
KIT ASPIRATION TUBING (SET/KITS/TRAYS/PACK) IMPLANT
LOOP CUTTING 24FR OLYMPUS (CUTTING LOOP) IMPLANT
LOOP ELECTRODE 28FR (MISCELLANEOUS) IMPLANT
NS IRRIG 500ML POUR BTL (IV SOLUTION) IMPLANT
PACK CYSTOSCOPY (CUSTOM PROCEDURE TRAY) ×5 IMPLANT
PLUG CATH AND CAP STER (CATHETERS) IMPLANT
SET ASPIRATION TUBING (TUBING) IMPLANT
SYRINGE IRR TOOMEY STRL 70CC (SYRINGE) ×2 IMPLANT
WATER STERILE IRR 3000ML UROMA (IV SOLUTION) ×3 IMPLANT

## 2012-09-05 NOTE — Interval H&P Note (Signed)
History and Physical Interval Note:  09/05/2012 11:20 AM  Shary Key  has presented today for surgery, with the diagnosis of history of ureteral / bladder cancer, [place] prostatic hypertrophy  The various methods of treatment have been discussed with the patient and family. After consideration of risks, benefits and other options for treatment, the patient has consented to  Procedure(s) (LRB) with comments: TRANSURETHRAL RESECTION OF BLADDER TUMOR (TURBT) (N/A) - 90 MIN CYSTO, TURBT, BILATERAL RETROGRADE PYELOGRAM, TUIP, POO RIGHT URETEROSCOPY NO STENTS  TRANSURETHRAL INCISION OF THE PROSTATE (TUIP) (N/A) CYSTOSCOPY WITH RETROGRADE PYELOGRAM, URETEROSCOPY AND STENT PLACEMENT (Bilateral) as a surgical intervention .  The patient's history has been reviewed, patient examined, no change in status, stable for surgery.  I have reviewed the patient's chart and labs.  Questions were answered to the patient's satisfaction.     Chelsea Aus

## 2012-09-05 NOTE — Anesthesia Procedure Notes (Signed)
Procedure Name: LMA Insertion Date/Time: 09/05/2012 11:25 AM Performed by: Fran Lowes Pre-anesthesia Checklist: Patient identified, Emergency Drugs available, Suction available and Patient being monitored Patient Re-evaluated:Patient Re-evaluated prior to inductionOxygen Delivery Method: Circle System Utilized Preoxygenation: Pre-oxygenation with 100% oxygen Intubation Type: IV induction Ventilation: Mask ventilation without difficulty LMA: LMA inserted LMA Size: 5.0 Number of attempts: 1 Airway Equipment and Method: bite block Placement Confirmation: positive ETCO2 Tube secured with: Tape Dental Injury: Teeth and Oropharynx as per pre-operative assessment

## 2012-09-05 NOTE — Anesthesia Preprocedure Evaluation (Signed)
Anesthesia Evaluation  Patient identified by MRN, date of birth, ID band Patient awake    Reviewed: Allergy & Precautions, H&P , NPO status , Patient's Chart, lab work & pertinent test results  Airway Mallampati: II TM Distance: >3 FB Neck ROM: Full    Dental No notable dental hx.    Pulmonary neg pulmonary ROS,  breath sounds clear to auscultation  Pulmonary exam normal       Cardiovascular hypertension, Pt. on medications negative cardio ROS  Rhythm:Regular Rate:Normal     Neuro/Psych negative neurological ROS  negative psych ROS   GI/Hepatic negative GI ROS, Neg liver ROS, GERD-  Medicated,  Endo/Other  diabetes  Renal/GU negative Renal ROS  negative genitourinary   Musculoskeletal negative musculoskeletal ROS (+)   Abdominal   Peds negative pediatric ROS (+)  Hematology negative hematology ROS (+)   Anesthesia Other Findings   Reproductive/Obstetrics negative OB ROS                           Anesthesia Physical Anesthesia Plan  ASA: III  Anesthesia Plan: General   Post-op Pain Management:    Induction: Intravenous  Airway Management Planned: LMA  Additional Equipment:   Intra-op Plan:   Post-operative Plan:   Informed Consent: I have reviewed the patients History and Physical, chart, labs and discussed the procedure including the risks, benefits and alternatives for the proposed anesthesia with the patient or authorized representative who has indicated his/her understanding and acceptance.   Dental advisory given  Plan Discussed with: CRNA and Surgeon  Anesthesia Plan Comments:         Anesthesia Quick Evaluation

## 2012-09-05 NOTE — H&P (Signed)
Urology History and Physical Exam  CC: history urothelial carcinoma of the right ureter  HPI: 67 year old male presents at this time for anesthetic cystoscopy, TURBT, and retrograde ureteropyelogram. His history as below:   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 This was done on 01/21/2012. Pathology revealed high-grade noninvasive urothelial carcinoma, with carcinoma in situ present at a margin. There was no papillary lesion at the margin however.  He had a double-J stent left in postoperatively. He was given 4 postoperative BCG treatments, the last on 04/11/2012.  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 Sputum smears were negative for AFB. He was found to have pneumonia, and treated appropriately with antitubercular drugs. Eventually, acid fast bacilli grew out in culture.  Double-J stent was removed in September, 2013.  He underwent routine surveillance in early December, 2013. He was found to have a bladder tumor at his bladder neck, but cystoscopy was difficult to cloudy urine.  He presents at this time for further surveillance and treatment of this recurrent bladder lesion.  PMH: Past Medical History  Diagnosis Date  . Hyperlipidemia, mild   . Increased prostate specific antigen (PSA) velocity   . Hypertension   . H/O hiatal hernia   . GERD (gastroesophageal reflux disease) occasionlly takes zantac  . Arthritis of shoulder region, left     lt. shoulder  . History of kidney stones   . Frequency of urination   . Urgency of urination   . Nocturia   . Hematuria   . BPH (benign prostatic hypertrophy)   . 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 Dysuria   . Incomplete emptying of bladder   . History of acute renal failure ADMISSION  04-11-2012  . Diabetes mellitus without complication     new diagnosis 12/13    PSH: Past  Surgical History  Procedure Date  . Anterior cervical decomp/discectomy fusion 03-09-2004    C5 - 6/ plates and screws retained  . Echo/ cardiology stress test 04-22-2009  [facility]    NORMAL  . Ureterolithotomy MARCH 2012-- [facility]  . Cysto/  resection bladder lesion with bx JULY 2012-- [facility]  . Rotator cuff repair 1997    LEFT  . Inguinal hernia repair 2006    RIGHT  . Cystoscopy w/ retrogrades 11/21/2011    Procedure: CYSTOSCOPY WITH RETROGRADE PYELOGRAM;  Surgeon: Marcine Matar, MD;  Location: [facility];  Service: Urology;  Laterality: Bilateral;  BLADDER BX  1 hour requested for this case    . Ureteral reimplantion 01/14/2012    Procedure: URETERAL REIMPLANT;  Surgeon: Marcine Matar, MD;  Location: [facility];  Service: Urology;  Laterality: Right;  RIGHT SEGMENTAL URETERECTOMY WITH RIGHT URETERAL REIMPLANT    . Ureterectomy 01/14/2012    Procedure: URETERECTOMY;  Surgeon: Marcine Matar, MD;  Location: [facility];  Service: Urology;  Laterality: Right;    Allergies: Allergies  Allergen Reactions  . Meloxicam Nausea And Vomiting  . Naproxen Nausea And Vomiting    Medications: No prescriptions prior to admission     Social History: History   Social History  . Marital Status: Married    Spouse Name: N/A    Number of Children: N/A  . Years of Education: N/A   Occupational History  . Not on file.   Social History Main Topics  . Smoking status: Former Smoker  Types: Cigarettes    Quit date: 01/08/1989  . Smokeless tobacco: Current User    Types: Chew     Comment: CHEWED TOBACCO FOR APPROX. 50 YRS - STATES HAS CUT-DOWN SINCE MARCH 2012  . Alcohol Use: No  . Drug Use: No  . Sexually Active: Not on file   Other Topics Concern  . Not on file   Social History Narrative  . No narrative on file    Family History: Family History  Problem Relation Age of Onset  . Stroke Mother   . Cancer Father     Review of Systems: Positive:  intermittent hematuria, cloudy urine, frequency, urgency Negative:  A further 10 point review of systems was negative except what is listed in the HPI.  Physical Exam: @VITALS2 @ General: No acute distress.  Awake. Head:  Normocephalic.  Atraumatic. ENT:  EOMI.  Mucous membranes moist Neck:  Supple.  No lymphadenopathy. CV:  S1 present. S2 present. Regular rate. Pulmonary: Equal effort bilaterally.  Clear to auscultation bilaterally. Abdomen: Soft.  Non tender to palpation. Skin:  Normal turgor.  No visible rash. Extremity: No gross deformity of bilateral upper extremities.  No gross deformity of    bilateral lower extremities. Neurologic: Alert. Appropriate mood.   Studies:  No results found for this basename: HGB:2,WBC:2,PLT:2 in the last 72 hours  No results found for this basename: NA:2,K:2,CL:2,CO2:2,BUN:2,CREATININE:2,CALCIUM:2,MAGNESIUM:2,GFRNONAA:2,GFRAA:2 in the last 72 hours   No results found for this basename: PT:2,INR:2,APTT:2 in the last 72 hours   No components found with this basename: ABG:2    Assessment:   1.History of urothelial carcinoma the bladder, first diagnosed in February, 2012 at the time of the right ureteroscopic stone extraction. He underwent immediate TURBT followed by induction BCG. Repeat cystoscopy in June, 2012 revealed recurrent urothelial carcinoma, he underwent repeat TURB at that time. He completed his second course of induction BCG in September, 2012. Followup cystoscopy in September 2012 in December 2012 were unremarkable. Followup cystoscopy at the time of stent extraction in September, 2013 was unremarkable  His pathology revealed high-grade papillary urothelial carcinoma without lamina propria or muscularis invasion.   2. BPH, symptoms moderate/severe, on maximal medical therapy. He carries a fair residual-this will need treatment  3. Urothelial carcinoma of the right distal ureter. He is status post right distal ureterectomy on 01/12/2012.  High-grade with out evidence of invasion. 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 I worry that there is a recurrent tumor at the right bladder neck   Plan: Anesthetic cystoscopy, bilateral retrograde ureteral pyelograms, possible ureteroscopy on the right, TURBT

## 2012-09-05 NOTE — Transfer of Care (Signed)
Immediate Anesthesia Transfer of Care Note  Patient: Frank Delacruz  Procedure(s) Performed: Procedure(s) (LRB): TRANSURETHRAL RESECTION OF BLADDER TUMOR (TURBT) (N/A) TRANSURETHRAL INCISION OF THE PROSTATE (TUIP) (N/A) CYSTOSCOPY WITH RETROGRADE PYELOGRAM (Left)  Patient Location: Patient transported to PACU with oxygen via face mask at 4 Liters / Min  Anesthesia Type: General  Level of Consciousness: awake and alert   Airway & Oxygen Therapy: Patient Spontanous Breathing and Patient connected to face mask oxygen  Post-op Assessment: Report given to PACU RN and Post -op Vital signs reviewed and stable  Post vital signs: Reviewed and stable  Dentition: Teeth and oropharynx remain in pre-op condition  Complications: No apparent anesthesia complications

## 2012-09-05 NOTE — Anesthesia Postprocedure Evaluation (Signed)
  Anesthesia Post-op Note  Patient: Frank Delacruz  Procedure(s) Performed: Procedure(s) (LRB): TRANSURETHRAL RESECTION OF BLADDER TUMOR (TURBT) (N/A) TRANSURETHRAL INCISION OF THE PROSTATE (TUIP) (N/A) CYSTOSCOPY WITH RETROGRADE PYELOGRAM (Left)  Patient Location: PACU  Anesthesia Type: General  Level of Consciousness: awake and alert   Airway and Oxygen Therapy: Patient Spontanous Breathing  Post-op Pain: mild  Post-op Assessment: Post-op Vital signs reviewed, Patient's Cardiovascular Status Stable, Respiratory Function Stable, Patent Airway and No signs of Nausea or vomiting  Last Vitals:  Filed Vitals:   09/05/12 1020  BP: 113/67  Pulse: 51  Temp: 37.3 C  Resp: 20    Post-op Vital Signs: stable   Complications: No apparent anesthesia complications

## 2012-09-05 NOTE — Op Note (Signed)
Preoperative diagnosis: History of urothelial carcinoma of the bladder and right ureter  Postoperative diagnosis: Same, with recurrence of urothelial carcinoma the bladder neck and bladder outlet obstruction   Procedure: Cystoscopy, left retrograde ureteropyelogram, left renal washings for cytology, attempted right ureteral catheterization for retrograde, TURBT of 3 cm bladder neck tumor, transurethral incision of prostate    Surgeon: Bertram Millard. Zacchary Pompei, M.D.   Anesthesia: Gen.   Complications: None other than inability to cannulate right ureter  Specimen(s): 1. Right renal washings for cytology and culture 2. Bladder neck tumor 3. Prostate chips  Drain(s): 22 French three-way Foley catheter to CBI  Indications: 67 year-old male with a history of bladder cancer and distal ureteral cancer. He has had a TURBT and right distal ureterectomy with reimplantation of his ureter in 2013. He presents at this time for surveillance cystoscopy, transurethral bladder resection of a bladder neck tumor and attempted retrogrades and ureteroscopy.    Technique and findings: The patient was properly identified in the holding area, received preoperative IV antibiotics, and was taken to the operating room where general anesthetic was administered with the LMA. He was placed in the dorsolithotomy position. Genitalia and perineum were prepped and draped. Proper timeout was then performed. A 22 French panendoscope with 12 lens was advanced through his urethra. There was a high riding bladder neck. No other urethral abnormalities were noted. There was an obvious papillary tumor at the bladder neck extending from the approximate 7:00 position clockwise to approximately 12:00. I then entered the bladder. The bladder was somewhat murky, and the bladder was irrigated free of sediment. The bladder was then inspected circumferentially both with the 12 and 70 lenses. There were multiple trabeculations. I was unable to  identify the right ureteral orifice which had been reimplanted following distal ureteral resection. Indigo carmine was given at this point. The left ureteral orifice was somewhat laterally placed and patulous. A retrograde ureteropyelogram was performed.  This revealed a dilated ureter from the bladder all the way to the renal pelvis and calyceal system. There were no significant filling defects of the ureter or the pyelo-calyceal system, although it took a significant amount of contrast to fill this. I eventually negotiated the open-ended catheter all the way to the left renal pelvis and using contrast filled the pyelo-calyceal system to complete the retrograde. Again, this revealed no filling defect but it was quite capacious. I then washed the renal pelvis with saline. A significant amount of pus came out. This was all emptied, but the washings percent for routine culture and for cytology. At this point, with no filling defects seen, I removed the open-ended catheter. Inspection and probing of the right bladder was unsuccessful in finding the ureteral orifice. I was able to, with a cystogram, navigate contrast up to the mid ureter. The bladder was somewhat high pressure, and I did not see the distal ureter because of filling of the bladder which was quite trabeculated. I did not see any filling defects at the mid ureter. Again, the proximal and distal ureter are not seen with this retrograde performed by cystogram.  At this point, the resectoscope was placed, and using the loop, and the gyrus device, transurethral resection of the entire bladder tumor was carried out to the muscle layer. There was a significant amount of resected tissue, this was sent as "bladder tumor". The resected bed was then cauterized.  Because of the high pressure bladder, and a high riding bladder neck, I felt it best to perform a  TUIP. The prostate/bladder neck was resected in the midline from the bladder neck to the verumontanum.  This tissue was sent as prostate chips. The resected prostate was then cauterized. There was no significant bleeding. At this point, a 46 [place], three-way Foley catheter was placed, the balloon filled with 30 cc of water, and was hooked to CBI.  The patient tolerated procedure well. He was awakened and taken to the PACU in stable condition.

## 2012-09-06 MED ORDER — PHENAZOPYRIDINE HCL 95 MG PO TABS: 95.0000 mg | ORAL_TABLET | Freq: Three times a day (TID) | ORAL | Status: DC | PRN

## 2012-09-06 MED ORDER — HYDROCODONE-ACETAMINOPHEN 5-325 MG PO TABS: 1.0000 | ORAL_TABLET | ORAL | Status: DC | PRN

## 2012-09-06 MED ORDER — CIPROFLOXACIN HCL 250 MG PO TABS: 250.0000 mg | ORAL_TABLET | Freq: Two times a day (BID) | ORAL | Status: DC

## 2012-09-06 NOTE — Care Management Utilization Note (Signed)
UR completed 

## 2012-09-06 NOTE — Progress Notes (Signed)
1 Day Post-Op Subjective: Patient reports feeling thing bothering him is his left knee. He has been up walking without significant difficulty. His catheter was removed earlier  Objective: Vital signs in last 24 hours: Temp:  [97.5 F (36.4 C)-99.1 F (37.3 C)] 98.9 F (37.2 C) (01/25 0526) Pulse Rate:  [51-95] 59  (01/25 0526) Resp:  [10-20] 18  (01/25 0526) BP: (102-130)/(55-87) 108/59 mmHg (01/25 0526) SpO2:  [94 %-98 %] 96 % (01/25 0526) Weight:  [81.647 kg (180 lb)] 81.647 kg (180 lb) (01/24 1355)  Intake/Output from previous day: 01/24 0701 - 01/25 0700 In: 2850 [P.O.:240; I.V.:1760] Out: 3675 [Urine:3675] Intake/Output this shift:    Physical Exam:  Constitutional: Vital signs reviewed. WD WN in NAD      Lab Results:  Basename 09/05/12 1043  HGB 14.3  HCT 42.0   BMET  Basename 09/05/12 1043  NA 144  K 5.0  CL 109  CO2 --  GLUCOSE 161*  BUN 21  CREATININE 1.50*  CALCIUM --   No results found for this basename: LABPT:3,INR:3 in the last 72 hours No results found for this basename: LABURIN:1 in the last 72 hours Results for orders placed during the hospital encounter of 04/11/12  CULTURE, BLOOD (ROUTINE X 2)     Status: Normal   Collection Time   04/11/12 11:36 PM      Component Value Range Status Comment   Specimen Description RIGHT ANTECUBITAL   Final    Special Requests BOTTLES DRAWN AEROBIC AND ANAEROBIC 6CC   Final    Culture NO GROWTH 5 DAYS   Final    Report Status 04/16/2012 FINAL   Final   CULTURE, BLOOD (ROUTINE X 2)     Status: Normal   Collection Time   04/11/12 11:43 PM      Component Value Range Status Comment   Specimen Description BLOOD RIGHT HAND   Final    Special Requests BOTTLES DRAWN AEROBIC AND ANAEROBIC 6CC   Final    Culture NO GROWTH 5 DAYS   Final    Report Status 04/16/2012 FINAL   Final   URINE CULTURE     Status: Normal   Collection Time   04/12/12  1:59 AM      Component Value Range Status Comment   Specimen  Description URINE, CATHETERIZED   Final    Special Requests NONE   Final    Culture  Setup Time 04/12/2012 19:27   Final    Colony Count NO GROWTH   Final    Culture NO GROWTH   Final    Report Status 04/14/2012 FINAL   Final   MRSA PCR SCREENING     Status: Normal   Collection Time   04/12/12  4:56 AM      Component Value Range Status Comment   MRSA by PCR NEGATIVE  NEGATIVE Final   AFB CULTURE, BLOOD     Status: Normal   Collection Time   04/14/12 11:05 AM      Component Value Range Status Comment   Specimen Description BLOOD ARM LEFT   Final    Special Requests AFB/3.5CC   Final    Culture NO ACID FAST BACILLI ISOLATED IN 6 WEEKS   Final    Report Status 05/27/2012 FINAL   Final   AFB CULTURE WITH SMEAR     Status: Normal   Collection Time   04/15/12  5:22 AM      Component Value Range Status Comment  Specimen Description SPUTUM   Final    Special Requests NONE   Final    ACID FAST SMEAR NO ACID FAST BACILLI SEEN   Final    Culture     Final    Value: MYCOBACTERIUM CHELONAE/ABSCESSUS GROUP     [id number] Note: CRITICAL RESULT CALLED TO, READ BACK BY AND VERIFIED WITH: TAMMY KING/RN @ 10:21A ON 05/23/12 BY NW FAXED TO 832 IDENTIFICATION PERFORMED AT [facility] SEE SEPARATE REPORT   Report Status 06/03/2012 FINAL   Final   AFB CULTURE WITH SMEAR     Status: Normal   Collection Time   04/15/12  2:28 PM      Component Value Range Status Comment   Specimen Description SPUTUM   Final    Special Requests PER BROOK MICHAEL,RN [id number]   Final    ACID FAST SMEAR NO ACID FAST BACILLI SEEN   Final    Culture     Final    Value: MYCOBACTERIUM CHELONAE/ABSCESSUS GROUP     Note: REFER TO PREVIOUS CULTURE [id number] FOR CALLS AND REPORTS SUSCEPTIBILITY PERFORMED A T [facility] REFER TO A SEPARATED REPORT FOR SUSCEPTIBILITY TEST   Report Status 07/23/2012 FINAL   Final     Studies/Results: No results found.  Assessment/Plan:   Postoperative day 1 cystoscopy, left retrograde  ureteropyelogram, TURBT of bladder neck tumor and transurethral incision of prostate. Except for some mild left knee pain, he is doing well. He has not voided yet.    I think it okay for him to go after he voids x2, if he does so successfully. He will go home on Cipro and take AZO for dysuria. I will call with followup of pathology.   LOS: 1 day   Chelsea Aus 09/06/2012, 7:53 AM

## 2012-09-07 ENCOUNTER — Emergency Department (HOSPITAL_COMMUNITY)
Admission: EM | Admit: 2012-09-07 | Discharge: 2012-09-07 | Disposition: A | Discharge status: Home / Self Care | Payer: Medicare Other | Attending: Emergency Medicine | Admitting: Emergency Medicine

## 2012-09-07 ENCOUNTER — Emergency Department (HOSPITAL_COMMUNITY): Payer: Medicare Other

## 2012-09-07 ENCOUNTER — Encounter (HOSPITAL_COMMUNITY): Payer: Self-pay | Admitting: Emergency Medicine

## 2012-09-07 DIAGNOSIS — M1710 Unilateral primary osteoarthritis, unspecified knee: Secondary | ICD-10-CM | POA: Insufficient documentation

## 2012-09-07 DIAGNOSIS — E119 Type 2 diabetes mellitus without complications: Secondary | ICD-10-CM | POA: Insufficient documentation

## 2012-09-07 DIAGNOSIS — Z87891 Personal history of nicotine dependence: Secondary | ICD-10-CM | POA: Insufficient documentation

## 2012-09-07 DIAGNOSIS — Z7982 Long term (current) use of aspirin: Secondary | ICD-10-CM | POA: Insufficient documentation

## 2012-09-07 DIAGNOSIS — Z79899 Other long term (current) drug therapy: Secondary | ICD-10-CM | POA: Insufficient documentation

## 2012-09-07 DIAGNOSIS — Z87442 Personal history of urinary calculi: Secondary | ICD-10-CM | POA: Insufficient documentation

## 2012-09-07 DIAGNOSIS — R972 Elevated prostate specific antigen [PSA]: Secondary | ICD-10-CM | POA: Insufficient documentation

## 2012-09-07 DIAGNOSIS — E785 Hyperlipidemia, unspecified: Secondary | ICD-10-CM | POA: Insufficient documentation

## 2012-09-07 DIAGNOSIS — Z87898 Personal history of other specified conditions: Secondary | ICD-10-CM | POA: Insufficient documentation

## 2012-09-07 DIAGNOSIS — I10 Essential (primary) hypertension: Secondary | ICD-10-CM | POA: Insufficient documentation

## 2012-09-07 DIAGNOSIS — Z8719 Personal history of other diseases of the digestive system: Secondary | ICD-10-CM | POA: Insufficient documentation

## 2012-09-07 DIAGNOSIS — M1990 Unspecified osteoarthritis, unspecified site: Secondary | ICD-10-CM

## 2012-09-07 DIAGNOSIS — M25469 Effusion, unspecified knee: Secondary | ICD-10-CM | POA: Insufficient documentation

## 2012-09-07 LAB — CBC WITH DIFFERENTIAL/PLATELET
Basophils Absolute: 0 10*3/uL (ref 0.0–0.1)
Basophils Relative: 0 % (ref 0–1)
Eosinophils Absolute: 0.1 10*3/uL (ref 0.0–0.7)
Eosinophils Relative: 0 % (ref 0–5)
Lymphocytes Relative: 21 % (ref 12–46)
MCH: 28.4 pg (ref 26.0–34.0)
MCV: 88.3 fL (ref 78.0–100.0)
Platelets: 193 10*3/uL (ref 150–400)
RDW: 14 % (ref 11.5–15.5)
WBC: 12.1 10*3/uL — ABNORMAL HIGH (ref 4.0–10.5)

## 2012-09-07 LAB — BASIC METABOLIC PANEL
Calcium: 9.7 mg/dL (ref 8.4–10.5)
GFR calc Af Amer: 61 mL/min — ABNORMAL LOW (ref >90)
GFR calc non Af Amer: 53 mL/min — ABNORMAL LOW (ref >90)
Sodium: 137 mEq/L (ref 135–145)

## 2012-09-07 LAB — URINE CULTURE: Colony Count: 65000

## 2012-09-07 LAB — BODY FLUID CELL COUNT WITH DIFFERENTIAL
Eos, Fluid: 0 %
Neutrophil Count, Fluid: 72 % — ABNORMAL HIGH (ref 0–25)
Other Cells, Fluid: 6 %

## 2012-09-07 MED ORDER — OXYCODONE-ACETAMINOPHEN 5-325 MG PO TABS
1.0000 | ORAL_TABLET | Freq: Once | ORAL | Status: AC
  Administered 2012-09-07: 1 via ORAL
  Filled 2012-09-07: qty 1

## 2012-09-07 MED ORDER — LIDOCAINE-EPINEPHRINE (PF) 2 %-1:200000 IJ SOLN
INTRAMUSCULAR | Status: AC
  Administered 2012-09-07: 07:00:00
  Filled 2012-09-07: qty 20

## 2012-09-07 MED ORDER — LIDOCAINE-EPINEPHRINE 2 %-1:100000 IJ SOLN: 20.0000 mL | Freq: Once | INTRAMUSCULAR | Status: AC

## 2012-09-07 NOTE — ED Notes (Signed)
Pt refused crutches and knee immobilizer, reports has both at home.

## 2012-09-07 NOTE — ED Provider Notes (Signed)
History     CSN: [id number]  Arrival date & time 09/07/12  4098   First MD Initiated Contact with Patient 09/07/12 [phone]      Chief Complaint  Patient presents with  . Knee Pain    (Consider location/radiation/quality/duration/timing/severity/associated sxs/prior treatment) HPIRalph J Gondek is a 67 y.o. male presenting with a swollen left knee. Patient's knee started swelling yesterday while he was walking around [facility], he was told to seek further medical attention if the swelling continued and pain continued. Patient's pain did continue her pain is now severe, 10 out of 10, well localized to the left knee, it hurts on walking, is worse on bending the knee,has limited his flexion, denies any fevers, chills, nausea or vomiting, abdominal pain, chest pain or shortness of breath. Patient has taken Vicodin for it and it has not helped. Patient had an episode of a swollen knee years ago - he sees Dr. Romeo Apple for his orthopedist    Past Medical History  Diagnosis Date  . Hyperlipidemia, mild   . Increased prostate specific antigen (PSA) velocity   . Hypertension   . H/O hiatal hernia   . GERD (gastroesophageal reflux disease) occasionlly takes zantac  . Arthritis of shoulder region, left     lt. shoulder  . History of kidney stones   . Frequency of urination   . Urgency of urination   . Nocturia   . Hematuria   . BPH (benign prostatic hypertrophy)   . 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 Dysuria   . Incomplete emptying of bladder   . History of acute renal failure ADMISSION  04-11-2012  . Diabetes mellitus without complication     new diagnosis 12/13    Past Surgical History  Procedure Date  . Anterior cervical decomp/discectomy fusion 03-09-2004    C5 - 6/ plates and screws retained  . Echo/ cardiology stress test 04-22-2009  DR [facility]    NORMAL  . Ureterolithotomy MARCH 2012-- [place]  . Cysto/  resection bladder  lesion with bx JULY 2012-- [facility]  . Rotator cuff repair 1997    LEFT  . Inguinal hernia repair 2006    RIGHT  . Cystoscopy w/ retrogrades 11/21/2011    Procedure: CYSTOSCOPY WITH RETROGRADE PYELOGRAM;  Surgeon: Marcine Matar, MD;  Location: [facility];  Service: Urology;  Laterality: Bilateral;  BLADDER BX  1 hour requested for this case    . Ureteral reimplantion 01/14/2012    Procedure: URETERAL REIMPLANT;  Surgeon: Marcine Matar, MD;  Location: [facility];  Service: Urology;  Laterality: Right;  RIGHT SEGMENTAL URETERECTOMY WITH RIGHT URETERAL REIMPLANT    . Ureterectomy 01/14/2012    Procedure: URETERECTOMY;  Surgeon: Marcine Matar, MD;  Location: [facility];  Service: Urology;  Laterality: Right;    Family History  Problem Relation Age of Onset  . Stroke Mother   . Cancer Father     History  Substance Use Topics  . Smoking status: Former Smoker    Types: Cigarettes    Quit date: 01/08/1989  . Smokeless tobacco: Current User    Types: Chew     Comment: CHEWED TOBACCO FOR APPROX. 50 YRS - STATES HAS CUT-DOWN SINCE MARCH 2012  . Alcohol Use: No      Review of Systems At least 10pt or greater review of systems completed and are negative except where specified in the HPI.  Allergies  Meloxicam and Naproxen  Home Medications  Current Outpatient Rx  Name  Route  Sig  Dispense  Refill  . ASPIRIN 81 MG PO TABS   Oral   Take 81 mg by mouth daily.         . BC HEADACHE POWDER PO   Oral   Take by mouth as needed.         . ATENOLOL 50 MG PO TABS   Oral   Take 50 mg by mouth every morning.          Marland Kitchen CIPROFLOXACIN HCL 250 MG PO TABS   Oral   Take 1 tablet (250 mg total) by mouth 2 (two) times daily.   15 tablet   0   . GLIPIZIDE ER 5 MG PO TB24   Oral   Take 5 mg by mouth daily.         Marland Kitchen HYDROCODONE-ACETAMINOPHEN 5-325 MG PO TABS   Oral   Take 1-2 tablets by mouth every 4 (four) hours as needed.   30 tablet   0   . LORAZEPAM  0.5 MG PO TABS   Oral   Take 0.5 mg by mouth every 8 (eight) hours.         Marland Kitchen METFORMIN HCL 500 MG PO TABS   Oral   Take 1,000 mg by mouth 2 (two) times daily.         Marland Kitchen PHENAZOPYRIDINE HCL 95 MG PO TABS   Oral   Take 1 tablet (95 mg total) by mouth 3 (three) times daily as needed for pain.   10 tablet   0     This is available over-the-counter   . POTASSIUM CHLORIDE CRYS ER 20 MEQ PO TBCR   Oral   Take 40 mEq by mouth as directed.         Marland Kitchen ZANTAC PO   Oral   Take 75 mg by mouth daily as needed. For acid reflux         . TADALAFIL 5 MG PO TABS   Oral   Take 5 mg by mouth daily as needed.           BP 119/63  Pulse 65  Temp 98.5 F (36.9 C) (Oral)  Resp 18  Ht 5\' 9"  (1.753 m)  Wt 185 lb (83.915 kg)  BMI 27.32 kg/m2  SpO2 96%  Physical Exam  Nursing notes reviewed.  Electronic medical record reviewed. VITAL SIGNS:   Filed Vitals:   09/07/12 0545 09/07/12 0737  BP: 131/74 119/63  Pulse: 80 65  Temp: 98.5 F (36.9 C)   TempSrc: Oral   Resp: 16 18  Height: 5\' 9"  (1.753 m)   Weight: 185 lb (83.915 kg)   SpO2: 94% 96%   CONSTITUTIONAL: Awake, oriented, appears non-toxic HENT: Atraumatic, normocephalic, oral mucosa pink and moist, airway patent. Maxillary edentulous. Nares patent without drainage. External ears normal. EYES: Conjunctiva clear, EOMI, PERRLA NECK: Trachea midline, non-tender, supple CARDIOVASCULAR: Normal heart rate, Normal rhythm, No murmurs, rubs, gallops PULMONARY/CHEST: Clear to auscultation, no rhonchi, wheezes, or rales. Symmetrical breath sounds. Non-tender. ABDOMINAL: Non-distended, soft, non-tender - no rebound or guarding.  BS normal. NEUROLOGIC: Non-focal, moving all four extremities, no gross sensory or motor deficits. EXTREMITIES: No clubbing, cyanosis, or edema. Left knee is swollen but not erythematous, slightly warmer than the right knee. Very tender to palpation patient has limited range of motion but can bend it to  approximately 15 of flexion. SKIN: Warm, Dry, No erythema, No rash  ED Course  ARTHOCENTESIS  Date/Time: 09/07/2012 8:58 AM Performed by: Jones Skene Authorized by: Jones Skene Consent: Verbal consent obtained. Written consent obtained. Risks and benefits: risks, benefits and alternatives were discussed Consent given by: patient Patient identity confirmed: verbally with patient Time out: Immediately prior to procedure a "time out" was called to verify the correct patient, procedure, equipment, support staff and site/side marked as required. Indications: joint swelling, pain and diagnostic evaluation  Body area: knee Joint: left knee Local anesthesia used: yes Local anesthetic: lidocaine 2% with epinephrine Anesthetic total: 10 ml Patient sedated: no Preparation: Patient was prepped and draped in the usual sterile fashion. Needle gauge: 18 G Approach: lateral Aspirate: cloudy and yellow Aspirate amount: 65 ml Patient tolerance: Patient tolerated the procedure well with no immediate complications.   (including critical care time)  Labs Reviewed  BODY FLUID CELL COUNT WITH DIFFERENTIAL - Abnormal; Notable for the following:    Color, Fluid YELLOW (*)     Appearance, Fluid TURBID (*)     WBC, Fluid 4768 (*)     Neutrophil Count, Fluid 72 (*)     Monocyte-Macrophage-Serous Fluid 16 (*)     All other components within normal limits  GLUCOSE, CAPILLARY - Abnormal; Notable for the following:    Glucose-Capillary 120 (*)     All other components within normal limits  CBC WITH DIFFERENTIAL - Abnormal; Notable for the following:    WBC 12.1 (*)     Hemoglobin 12.1 (*)     HCT 37.6 (*)     Monocytes Relative 20 (*)     Monocytes Absolute 2.4 (*)     All other components within normal limits  BASIC METABOLIC PANEL - Abnormal; Notable for the following:    Glucose, Bld 123 (*)     GFR calc non Af Amer 53 (*)     GFR calc Af Amer 61 (*)     All other components within  normal limits  BODY FLUID CULTURE  GLUCOSE, SYNOVIAL FLUID  BODY FLUID CRYSTAL   Dg Knee Complete 4 Views Left  09/07/2012  *RADIOLOGY REPORT*  Clinical Data: Left knee pain.  Swelling and pain.  LEFT KNEE - COMPLETE 4+ VIEW  Comparison: None  Findings: Marked tricompartmental degenerative changes identified with severe loss of the joint space height involving both medial and lateral compartments.  No evidence of acute fracture, dislocation, bony lesion or erosions.  No significant joint effusion is identified.  There is some degenerative calcification involving the distal quadriceps tendon.  IMPRESSION: No acute findings.  Marked and advanced tricompartmental degenerative arthritis.   Original Report Authenticated By: Irish Lack, M.D.      1. Degenerative joint disease   2. Knee effusion       MDM  ARISTEO HANKERSON is a 67 y.o. male presenting with crystal arthropathy of the left knee vs septic arthritis.  Performed a left knee arthrocentesis as dictated above. Pt took pain medicine prior to coming to the ED and did not receive further systemic medicine.  Pt felt much better after arthrocentesis.  PT final disposition left to Dr. Lynelle Doctor, lab results of synovial fluid analysis still pending - please see his note for final disposition.         Jones Skene, MD 09/09/12 [phone]

## 2012-09-07 NOTE — ED Provider Notes (Signed)
Please see Dr. Hoyt Koch note for his initial evaluation of the patient in emergent apartment.  Patient was recently admitted to the hospital for a urological procedure. He was released within the last day or 2.He is currently taking Cipro.  I was asked to followup on the results of his arthrocentesis.  Results do indicate an elevated white blood cell count as well as and elevated white blood cell count in the synovial fluid of 4768.  On exam he does have persistent effusion with pain with range of motion. The knee is not particularly erythematous.  It is slightly warm to the touch.  Consult with the orthopedic doctor on call regarding further treatment.patient does have severe degenerative joint findings. I suspect this is most likely etiology for his knee swelling as opposed to a septic arthritis.  I discussed the case with Dr. Romeo Apple. We would expect a more significantly elevated white blood cell count ain his synovial fluid if this was a septic arthritis. Certainly cannot completely exclude that but at this time it is reasonable to treat him presumptively for over an inflammatory arthritis as opposed to septic arthritis. A synovial fluid culture was sent off for further analysis.   Celene Kras, MD 09/07/12 1141

## 2012-09-07 NOTE — ED Notes (Signed)
Pt c/o left knee pain and swelling. States he was told yesterday at [facility] to be seen if his knee continued to swell.  Pt states it has gotten worse and his pain medications are not working

## 2012-09-08 ENCOUNTER — Encounter (HOSPITAL_BASED_OUTPATIENT_CLINIC_OR_DEPARTMENT_OTHER): Payer: Self-pay | Admitting: Urology

## 2012-09-11 LAB — BODY FLUID CULTURE: Culture: NO GROWTH

## 2012-09-12 ENCOUNTER — Other Ambulatory Visit: Payer: Self-pay | Admitting: Urology

## 2012-09-12 DIAGNOSIS — C679 Malignant neoplasm of bladder, unspecified: Secondary | ICD-10-CM

## 2012-09-19 ENCOUNTER — Ambulatory Visit (HOSPITAL_COMMUNITY)
Admission: RE | Admit: 2012-09-19 | Discharge: 2012-09-19 | Disposition: A | Discharge status: Home / Self Care | Payer: Medicare Other | Source: Ambulatory Visit | Attending: Urology | Admitting: Urology

## 2012-09-19 ENCOUNTER — Encounter (HOSPITAL_COMMUNITY): Payer: Self-pay

## 2012-09-19 DIAGNOSIS — C679 Malignant neoplasm of bladder, unspecified: Secondary | ICD-10-CM

## 2012-09-19 DIAGNOSIS — C689 Malignant neoplasm of urinary organ, unspecified: Secondary | ICD-10-CM | POA: Insufficient documentation

## 2012-09-19 MED ORDER — IOHEXOL 300 MG/ML  SOLN
100.0000 mL | Freq: Once | INTRAMUSCULAR | Status: AC | PRN
  Administered 2012-09-19: 100 mL via INTRAVENOUS

## 2012-09-23 ENCOUNTER — Ambulatory Visit (INDEPENDENT_AMBULATORY_CARE_PROVIDER_SITE_OTHER): Payer: Medicare Other | Admitting: Urology

## 2012-09-23 DIAGNOSIS — C669 Malignant neoplasm of unspecified ureter: Secondary | ICD-10-CM

## 2012-11-06 NOTE — Discharge Summary (Signed)
Patient ID: GLENARD KEESLING MRN: [id number] DOB/AGE: 01/04/1946 67 y.o.  Admit date: 09/05/2012 Discharge date: 11/06/2012  Primary Care Physician:  Josue Hector, MD  Discharge Diagnoses:   History of urothelial carcinoma of the bladder, bladder outlet obstruction  Consults:  None    None   Discharge Medications:   Medication List    STOP taking these medications       finasteride 5 MG tablet  Commonly known as:  PROSCAR      TAKE these medications       aspirin 81 MG tablet  Take 81 mg by mouth daily.     atenolol 50 MG tablet  Commonly known as:  TENORMIN  Take 50 mg by mouth every morning.     BC HEADACHE POWDER PO  Take by mouth as needed.     ciprofloxacin 250 MG tablet  Commonly known as:  CIPRO  Take 1 tablet (250 mg total) by mouth 2 (two) times daily.     glipiZIDE 5 MG 24 hr tablet  Commonly known as:  GLUCOTROL XL  Take 5 mg by mouth daily.     HYDROcodone-acetaminophen 5-325 MG per tablet  Commonly known as:  NORCO/VICODIN  Take 1-2 tablets by mouth every 4 (four) hours as needed.     LORazepam 0.5 MG tablet  Commonly known as:  ATIVAN  Take 0.5 mg by mouth every 8 (eight) hours.     metFORMIN 500 MG tablet  Commonly known as:  GLUCOPHAGE  Take 1,000 mg by mouth 2 (two) times daily.     phenazopyridine 95 MG tablet  Commonly known as:  PYRIDIUM  Take 1 tablet (95 mg total) by mouth 3 (three) times daily as needed for pain.     potassium chloride SA 20 MEQ tablet  Commonly known as:  K-DUR,KLOR-CON  Take 40 mEq by mouth as directed.     tadalafil 5 MG tablet  Commonly known as:  CIALIS  Take 5 mg by mouth daily as needed.     ZANTAC PO  Take 75 mg by mouth daily as needed. For acid reflux         Significant Diagnostic Studies:  Ct Abdomen Pelvis W Wo Contrast  09/19/2012  *RADIOLOGY REPORT*  Clinical Data: Bladder cancer.  Hematuria.  History of renal calculi and BPH.  CT ABDOMEN AND PELVIS WITHOUT AND WITH CONTRAST   Technique:  Multidetector CT imaging of the abdomen and pelvis was performed without contrast material in one or both body regions, followed by contrast material(s) and further sections in one or both body regions.  The patient was given the usual instructions regarding cessation of metformin therapy related to intravenous contrast administration and is to follow up with the ordering physician.  Contrast: OMNIPAQUE IOHEXOL 300 MG/ML  SOLN  Comparison: Abdominal pelvic CT 11/30/2011.  Findings: The precontrast images demonstrate no renal, ureteral or bladder calculi.  Postcontrast, there is heterogeneous enhancement throughout the right kidney, especially within the upper pole. There is progressive wall thickening of both renal pelves and ureters.  The right ureter is mildly dilated and demonstrates delayed excretion.  There is enhancing soft tissue within the mid right ureter (axial image 52 of series 6).  There is no adjacent retroperitoneal mass.  There is progressive irregular bladder wall thickening which is asymmetric towards the right ureteral vesicle junction.  There is no obstruction of the left ureter.  The lung bases are clear.  There is no pleural effusion.  There  is no adrenal mass.  The liver, gallbladder, biliary system, pancreas and spleen appear normal.  There are no pathologically enlarged abdominal pelvic lymph nodes. Small external iliac lymph nodes are not significantly enlarged. Retroperitoneal lymph nodes at the renal hilum are prominent but retain fatty hila.  There is stable aorto iliac atherosclerosis and mild aortic dilatation.  The stomach, small bowel, appendix and colon demonstrate no significant findings.  There is mild sigmoid colon diverticulosis. There is stable postsurgical change in the right groin status post hernia repair.  No recurrent hernia is identified.  Fat within the left inguinal canal is stable.  There are stable lumbar spine degenerative changes.  No worrisome  osseous lesions are identified.  IMPRESSION:  1.  There is progressive irregular diffuse bladder wall thickening worrisome for progression of bladder cancer. 2.  There is delayed excretion from the right kidney with dilatation of the proximal right ureter and enhancing soft tissue in the mid ureter.  Findings could be secondary to proximal migration of transitional cell carcinoma.  Alternately, the enhancement could be due to inflammation superimposed on distal obstruction at the ureteral vesicle junction. 3.  No distant metastases identified.  Small external iliac lymph nodes do not appear pathologically enlarged.   Original Report Authenticated By: Carey Bullocks, M.D.     Brief H and P: For complete details please refer to admission H and P, but in brief patient was admitted for cystoscopic examination of the bladder as well as TURBT and bilateral retrograde pyelograms. He has a history of urothelial carcinoma the bladder, as well as the right distal ureter. He is status post right distal ureterectomy with reimplantation the past.  Hospital Course:  The patient was admitted directly to the operating room, where he underwent cystoscopy, TURBT of the bladder neck lesion, transurethral incision of the prostate for obstructive symptoms and left retrograde ureteropyelogram. Right ureter was not identified during the procedure. He tolerated the procedure well, had a catheter placed following this which was removed on postoperative day #1. He voided adequately and was discharged on postoperative day 1.  Day of Discharge BP 109/60  Pulse 59  Temp(Src) 98.2 F (36.8 C) (Oral)  Resp 18  Ht 5\' 6"  (1.676 m)  Wt 81.647 kg (180 lb)  BMI 29.07 kg/m2  SpO2 97%  No results found for this or any previous visit (from the past 24 hour(s)).  Physical Exam: General: Alert and awake oriented x3 not in any acute distress. HEENT: anicteric sclera, pupils reactive to light and accommodation CVS: S1-S2 clear no  murmur rubs or gallops Chest: clear to auscultation bilaterally, no wheezing rales or rhonchi Abdomen: soft nontender, nondistended, normal bowel sounds, no organomegaly Extremities: no cyanosis, clubbing or edema noted bilaterally Neuro: Cranial nerves II-XII intact, no focal neurological deficits  Disposition:  Home  Diet:  No restrictions  Activity:  No restrictions   Disposition and Follow-up:     Discharge Orders   Future Orders Complete By Expires     Discharge patient  As directed     Comments:      After he has voided successfully x2        Follow up with pathology  TESTS THAT NEED FOLLOW-UP  Pathology  DISCHARGE FOLLOW-UP   Time spent on Discharge:  15 minutes  Signed: Chelsea Aus 11/06/2012, 11:03 AM

## 2013-01-19 ENCOUNTER — Other Ambulatory Visit: Payer: Self-pay | Admitting: Urology

## 2013-01-19 DIAGNOSIS — D4120 Neoplasm of uncertain behavior of unspecified ureter: Secondary | ICD-10-CM

## 2013-01-23 ENCOUNTER — Ambulatory Visit (HOSPITAL_COMMUNITY)
Admission: RE | Admit: 2013-01-23 | Discharge: 2013-01-23 | Disposition: A | Discharge status: Home / Self Care | Payer: Medicare Other | Source: Ambulatory Visit | Attending: Urology | Admitting: Urology

## 2013-01-23 DIAGNOSIS — N3289 Other specified disorders of bladder: Secondary | ICD-10-CM | POA: Insufficient documentation

## 2013-01-23 DIAGNOSIS — D4959 Neoplasm of unspecified behavior of other genitourinary organ: Secondary | ICD-10-CM | POA: Insufficient documentation

## 2013-01-23 DIAGNOSIS — D4120 Neoplasm of uncertain behavior of unspecified ureter: Secondary | ICD-10-CM

## 2013-01-23 DIAGNOSIS — N1330 Unspecified hydronephrosis: Secondary | ICD-10-CM | POA: Insufficient documentation

## 2013-01-27 ENCOUNTER — Other Ambulatory Visit: Payer: Self-pay | Admitting: Radiology

## 2013-01-27 ENCOUNTER — Other Ambulatory Visit: Payer: Self-pay | Admitting: Urology

## 2013-01-27 DIAGNOSIS — N1330 Unspecified hydronephrosis: Secondary | ICD-10-CM

## 2013-01-28 ENCOUNTER — Encounter (HOSPITAL_COMMUNITY): Payer: Self-pay | Admitting: Pharmacy Technician

## 2013-01-29 ENCOUNTER — Other Ambulatory Visit: Payer: Self-pay | Admitting: Urology

## 2013-01-30 ENCOUNTER — Encounter (HOSPITAL_COMMUNITY): Payer: Self-pay

## 2013-01-30 ENCOUNTER — Ambulatory Visit (HOSPITAL_COMMUNITY)
Admission: RE | Admit: 2013-01-30 | Discharge: 2013-01-30 | Disposition: A | Discharge status: Home / Self Care | Payer: Medicare Other | Source: Ambulatory Visit | Attending: Urology | Admitting: Urology

## 2013-01-30 ENCOUNTER — Other Ambulatory Visit: Payer: Self-pay | Admitting: Urology

## 2013-01-30 VITALS — BP 92/53 | HR 48 | Temp 97.8°F | Resp 16

## 2013-01-30 DIAGNOSIS — N1330 Unspecified hydronephrosis: Secondary | ICD-10-CM

## 2013-01-30 DIAGNOSIS — E119 Type 2 diabetes mellitus without complications: Secondary | ICD-10-CM | POA: Insufficient documentation

## 2013-01-30 DIAGNOSIS — N135 Crossing vessel and stricture of ureter without hydronephrosis: Secondary | ICD-10-CM | POA: Insufficient documentation

## 2013-01-30 DIAGNOSIS — K219 Gastro-esophageal reflux disease without esophagitis: Secondary | ICD-10-CM | POA: Insufficient documentation

## 2013-01-30 DIAGNOSIS — E785 Hyperlipidemia, unspecified: Secondary | ICD-10-CM | POA: Insufficient documentation

## 2013-01-30 DIAGNOSIS — I10 Essential (primary) hypertension: Secondary | ICD-10-CM | POA: Insufficient documentation

## 2013-01-30 DIAGNOSIS — Z79899 Other long term (current) drug therapy: Secondary | ICD-10-CM | POA: Insufficient documentation

## 2013-01-30 HISTORY — DX: Meningitis, unspecified: G03.9

## 2013-01-30 HISTORY — DX: Headache: R51

## 2013-01-30 LAB — BASIC METABOLIC PANEL
Calcium: 9.3 mg/dL (ref 8.4–10.5)
GFR calc non Af Amer: 45 mL/min — ABNORMAL LOW (ref >90)
Glucose, Bld: 111 mg/dL — ABNORMAL HIGH (ref 70–99)
Sodium: 139 mEq/L (ref 135–145)

## 2013-01-30 LAB — CBC WITH DIFFERENTIAL/PLATELET
Basophils Relative: 0 % (ref 0–1)
Eosinophils Absolute: 0.2 10*3/uL (ref 0.0–0.7)
Lymphs Abs: 2.5 10*3/uL (ref 0.7–4.0)
MCH: 27.4 pg (ref 26.0–34.0)
MCHC: 31.9 g/dL (ref 30.0–36.0)
Neutrophils Relative %: 59 % (ref 43–77)
Platelets: 190 10*3/uL (ref 150–400)
RBC: 4.46 MIL/uL (ref 4.22–5.81)

## 2013-01-30 LAB — PROTIME-INR
INR: 0.99 (ref 0.00–1.49)
Prothrombin Time: 13 seconds (ref 11.6–15.2)

## 2013-01-30 LAB — APTT: aPTT: 36 seconds (ref 24–37)

## 2013-01-30 LAB — GLUCOSE, CAPILLARY: Glucose-Capillary: 105 mg/dL — ABNORMAL HIGH (ref 70–99)

## 2013-01-30 MED ORDER — IOHEXOL 300 MG/ML  SOLN
20.0000 mL | Freq: Once | INTRAMUSCULAR | Status: AC | PRN
  Administered 2013-01-30: 1 mL

## 2013-01-30 MED ORDER — HYDROCODONE-ACETAMINOPHEN 5-325 MG PO TABS
1.0000 | ORAL_TABLET | ORAL | Status: DC | PRN
  Administered 2013-01-30: 1 via ORAL
  Filled 2013-01-30: qty 1

## 2013-01-30 MED ORDER — FENTANYL CITRATE 0.05 MG/ML IJ SOLN
INTRAMUSCULAR | Status: AC
  Filled 2013-01-30: qty 6

## 2013-01-30 MED ORDER — FENTANYL CITRATE 0.05 MG/ML IJ SOLN
INTRAMUSCULAR | Status: AC | PRN
  Administered 2013-01-30: 100 ug via INTRAVENOUS

## 2013-01-30 MED ORDER — SODIUM CHLORIDE 0.9 % IV SOLN
INTRAVENOUS | Status: DC
  Administered 2013-01-30: 500 mL via INTRAVENOUS

## 2013-01-30 MED ORDER — MIDAZOLAM HCL 2 MG/2ML IJ SOLN
INTRAMUSCULAR | Status: AC | PRN
  Administered 2013-01-30 (×2): 1 mg via INTRAVENOUS

## 2013-01-30 MED ORDER — CIPROFLOXACIN IN D5W 400 MG/200ML IV SOLN
400.0000 mg | Freq: Once | INTRAVENOUS | Status: AC
  Administered 2013-01-30: 400 mg via INTRAVENOUS

## 2013-01-30 MED ORDER — CIPROFLOXACIN IN D5W 400 MG/200ML IV SOLN
INTRAVENOUS | Status: AC
  Filled 2013-01-30: qty 200

## 2013-01-30 MED ORDER — LIDOCAINE HCL 1 % IJ SOLN
INTRAMUSCULAR | Status: AC
  Filled 2013-01-30: qty 20

## 2013-01-30 MED ORDER — MIDAZOLAM HCL 2 MG/2ML IJ SOLN
INTRAMUSCULAR | Status: AC
  Filled 2013-01-30: qty 6

## 2013-01-30 NOTE — H&P (Signed)
Chief Complaint: "I'm here for a kidney drain" Referring Physician:Dahlstedt HPI: Frank Delacruz is an 67 y.o. male with hx of urothelial cancer of bladder and right ureter, s/p ureteral reimplanting. He recently had a cystoscopy, TURBT and (L)retrograde ureteropyleogram, but they were not able to get into the right ureter. There was some thickening of the right ureter on recent CT and US shows significant hydronephrosis on the right. He is referred for nephrostomy placement for both decompression and access for ureteroscopy. PMHx and meds reviewed.   Past Medical History:  Past Medical History  Diagnosis Date  . Hyperlipidemia, mild   . Increased prostate specific antigen (PSA) velocity   . Hypertension   . H/O hiatal hernia   . GERD (gastroesophageal reflux disease) occasionlly takes zantac  . Arthritis of shoulder region, left     lt. shoulder  . History of kidney stones   . Frequency of urination   . Urgency of urination   . Nocturia   . Hematuria   . BPH (benign prostatic hypertrophy)   . Dysuria   . Incomplete emptying of bladder   . History of acute renal failure ADMISSION  04-11-2012  . Diabetes mellitus without complication     new diagnosis 12/13  . Headache(784.0)   . Meningitis spinal 1949  . 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 Anterior cervical decomp/discectomy fusion  03-09-2004    C5 - 6/ plates and screws retained  . Echo/ cardiology stress test  04-22-2009  DR [facility]    NORMAL  . Ureterolithotomy  MARCH 2012-- [facility]  . Cysto/  resection bladder lesion with bx  JULY 2012-- [facility]  . Rotator cuff repair  1997    LEFT  . Inguinal hernia repair  2006    RIGHT  . Cystoscopy w/ retrogrades  11/21/2011    Procedure: CYSTOSCOPY WITH RETROGRADE PYELOGRAM;  Surgeon: Marcine Matar, MD;  Location[facility];   Service: Urology;  Laterality: Bilateral;  BLADDER BX  1 hour requested for this case    . Ureteral reimplantion  01/14/2012    Procedure: URETERAL REIMPLANT;  Surgeon: Marcine Matar, MD;  Location: [facility];  Service: Urology;  Laterality: Right;  RIGHT SEGMENTAL URETERECTOMY WITH RIGHT URETERAL REIMPLANT    . Ureterectomy  01/14/2012    Procedure: URETERECTOMY;  Surgeon: Marcine Matar, MD;  Location: [facility];  Service: Urology;  Laterality: Right;  . Transurethral resection of bladder tumor  09/05/2012    Procedure: TRANSURETHRAL RESECTION OF BLADDER TUMOR (TURBT);  Surgeon: Marcine Matar, MD;  Location: [facility];  Service: Urology;  Laterality: N/A;  90 MIN CYSTO, TURBT, left RETROGRADE PYELOGRAM, TUIP,  NO STENTS   . Transurethral incision of prostate  09/05/2012    Procedure: TRANSURETHRAL INCISION OF THE PROSTATE (TUIP);  Surgeon: Marcine Matar, MD;  Location: [facility];  Service: Urology;  Laterality: N/A;  . Cystoscopy w/ retrogrades  09/05/2012    Procedure: CYSTOSCOPY WITH RETROGRADE PYELOGRAM;  Surgeon: Marcine Matar, MD;  Location: [facility];  Service: Urology;  Laterality: Left;    Family History:  Family History  Problem Relation Age of Onset  . Stroke Mother   . Cancer Father     Social History:  reports that he quit smoking about 24 years ago. His smoking use included Cigarettes. He smoked 0.00  packs per day. His smokeless tobacco use includes Chew. He reports that he does not drink alcohol or use illicit drugs.  Allergies:  Allergies  Allergen Reactions  . Meloxicam Nausea And Vomiting  . Naproxen Nausea And Vomiting    Medications: Aspirin-Salicylamide-Caffeine (BC HEADACHE POWDER PO) (Taking) Sig - Route: Take by mouth as needed.  Atenolol (TENORMIN) 50 MG tablet (Taking) 02/18/2012 Sig - Route: Take 50 mg by mouth every morning. GlipiZIDE (GLUCOTROL XL) 5 MG 24 hr tablet (Taking) Sig - Route: Take  5 mg by mouth daily.  LORazepam (ATIVAN) 0.5 MG tablet (Taking) Sig - Route: Take 0.5 mg by mouth every 8 (eight) hours as needed for anxiety. - metFORMIN (GLUCOPHAGE) 500 MG tablet (Taking) Sig - Route: Take 500 mg by mouth 2 (two) times daily. Ranitidine HCl (ZANTAC PO) (Taking) Sig - Route: Take 75 mg by mouth daily as needed (for heartburn). Tadalafil (CIALIS) 5 MG tablet (Taking) Sig - Route: Take 5 mg by mouth daily as needed. - Oral Class: Historical Med   Please HPI for pertinent positives, otherwise complete 10 system ROS negative.  Physical Exam: Temp: 98, HR: 53,  BP: 118/68, RR: 16, O2: 98%   General Appearance:  Alert, cooperative, no distress, appears stated age  Head:  Normocephalic, without obvious abnormality, atraumatic  ENT: Unremarkable  Neck: Supple, symmetrical, trachea midline  Lungs:   Clear to auscultation bilaterally, no w/r/r, respirations unlabored without use of accessory muscles.  Chest Wall:  No tenderness or deformity  Heart:  Regular rate and rhythm, S1, S2 normal, no murmur, rub or gallop.  Abdomen:   Soft, non-tender, non distended.  Neurologic: Normal affect, no gross deficits.   Results for orders placed during the hospital encounter of 01/30/13 (from the past 48 hour(s))  APTT     Status: None   Collection Time    01/30/13 10:55 AM      Result Value Range   aPTT 36  24 - 37 seconds  BASIC METABOLIC PANEL     Status: Abnormal   Collection Time    01/30/13 10:55 AM      Result Value Range   Sodium 139  135 - 145 mEq/L   Potassium 5.0  3.5 - 5.1 mEq/L   Chloride 106  96 - 112 mEq/L   CO2 26  19 - 32 mEq/L   Glucose, Bld 111 (*) 70 - 99 mg/dL   BUN 22  6 - 23 mg/dL   Creatinine, Ser 9.56 (*) 0.50 - 1.35 mg/dL   Calcium 9.3  8.4 - 21.3 mg/dL   GFR calc non Af Amer 45 (*) >90 mL/min   GFR calc Af Amer 52 (*) >90 mL/min   Comment:            The eGFR has been calculated     using the CKD EPI equation.     This calculation has not been      validated in all clinical     situations.     eGFR's persistently     <90 mL/min signify     possible Chronic Kidney Disease.  CBC WITH DIFFERENTIAL     Status: Abnormal   Collection Time    01/30/13 10:55 AM      Result Value Range   WBC 9.0  4.0 - 10.5 K/uL   RBC 4.46  4.22 - 5.81 MIL/uL   Hemoglobin 12.2 (*) 13.0 - 17.0 g/dL   HCT 08.6 (*) 57.8 - 46.9 %  MCV 85.9  78.0 - 100.0 fL   MCH 27.4  26.0 - 34.0 pg   MCHC 31.9  30.0 - 36.0 g/dL   RDW 81.1  91.4 - 78.2 %   Platelets 190  150 - 400 K/uL   Neutrophils Relative % 59  43 - 77 %   Neutro Abs 5.3  1.7 - 7.7 K/uL   Lymphocytes Relative 27  12 - 46 %   Lymphs Abs 2.5  0.7 - 4.0 K/uL   Monocytes Relative 12  3 - 12 %   Monocytes Absolute 1.1 (*) 0.1 - 1.0 K/uL   Eosinophils Relative 2  0 - 5 %   Eosinophils Absolute 0.2  0.0 - 0.7 K/uL   Basophils Relative 0  0 - 1 %   Basophils Absolute 0.0  0.0 - 0.1 K/uL  PROTIME-INR     Status: None   Collection Time    01/30/13 10:55 AM      Result Value Range   Prothrombin Time 13.0  11.6 - 15.2 seconds   INR 0.99  0.00 - 1.49   No results found.  Assessment/Plan Rt hydronephrosis Discussed Rt percutaneous nephrostomy tube placement. Explained procedure, risks, complications, use of sedation, and expectations with drain in place. Labs reviewed. Consent signed in chart  Brayton El PA-C 01/30/2013, 12:25 PM

## 2013-01-30 NOTE — Procedures (Signed)
R perc nephrostomy Bloody purulent urine out, sample for GS, C&S No complication No blood loss. See complete dictation in [facility].

## 2013-02-01 LAB — URINE CULTURE: Culture: NO GROWTH

## 2013-02-02 ENCOUNTER — Encounter (HOSPITAL_COMMUNITY): Payer: Self-pay | Admitting: Urology

## 2013-02-02 NOTE — Progress Notes (Signed)
  Spoke with patient over phone regarding current medications and history.  Patient given preop instructions.  Will also call wife at work and give her instructions.

## 2013-02-02 NOTE — Progress Notes (Signed)
Spoke with wife over phone and wife was given preop instructions to include npo after midnite, may have clear liquids until 0730am then npo.  hibiclens shower instructions.  Wife voiced understanding and has instructions to call us back with any questions.

## 2013-02-03 ENCOUNTER — Other Ambulatory Visit: Payer: Self-pay | Admitting: Urology

## 2013-02-05 ENCOUNTER — Observation Stay (HOSPITAL_COMMUNITY)
Admission: RE | Admit: 2013-02-05 | Discharge: 2013-02-06 | Disposition: A | Discharge status: Home / Self Care | Payer: Medicare Other | Source: Ambulatory Visit | Attending: Urology | Admitting: Urology

## 2013-02-05 ENCOUNTER — Ambulatory Visit (HOSPITAL_COMMUNITY): Payer: Medicare Other

## 2013-02-05 ENCOUNTER — Encounter (HOSPITAL_COMMUNITY)
Admission: RE | Disposition: A | Discharge status: Home / Self Care | Payer: Self-pay | Source: Ambulatory Visit | Attending: Urology

## 2013-02-05 ENCOUNTER — Encounter (HOSPITAL_COMMUNITY): Payer: Self-pay | Admitting: *Deleted

## 2013-02-05 ENCOUNTER — Ambulatory Visit (HOSPITAL_COMMUNITY): Payer: Medicare Other | Admitting: Anesthesiology

## 2013-02-05 ENCOUNTER — Encounter (HOSPITAL_COMMUNITY): Payer: Self-pay | Admitting: Anesthesiology

## 2013-02-05 DIAGNOSIS — E119 Type 2 diabetes mellitus without complications: Secondary | ICD-10-CM | POA: Insufficient documentation

## 2013-02-05 DIAGNOSIS — C669 Malignant neoplasm of unspecified ureter: Secondary | ICD-10-CM | POA: Insufficient documentation

## 2013-02-05 DIAGNOSIS — E785 Hyperlipidemia, unspecified: Secondary | ICD-10-CM | POA: Insufficient documentation

## 2013-02-05 DIAGNOSIS — N1330 Unspecified hydronephrosis: Principal | ICD-10-CM | POA: Insufficient documentation

## 2013-02-05 DIAGNOSIS — C679 Malignant neoplasm of bladder, unspecified: Secondary | ICD-10-CM | POA: Insufficient documentation

## 2013-02-05 DIAGNOSIS — K219 Gastro-esophageal reflux disease without esophagitis: Secondary | ICD-10-CM | POA: Insufficient documentation

## 2013-02-05 DIAGNOSIS — I10 Essential (primary) hypertension: Secondary | ICD-10-CM | POA: Insufficient documentation

## 2013-02-05 HISTORY — PX: NEPHROLITHOTOMY: SHX5134

## 2013-02-05 HISTORY — PX: URETEROSCOPY: SHX842

## 2013-02-05 HISTORY — PX: CYSTOSCOPY: SHX5120

## 2013-02-05 LAB — GLUCOSE, CAPILLARY
Glucose-Capillary: 94 mg/dL (ref 70–99)
Glucose-Capillary: 95 mg/dL (ref 70–99)

## 2013-02-05 LAB — CBC
MCH: 26.5 pg (ref 26.0–34.0)
MCHC: 30.5 g/dL (ref 30.0–36.0)
Platelets: 157 10*3/uL (ref 150–400)

## 2013-02-05 LAB — BASIC METABOLIC PANEL
Calcium: 9 mg/dL (ref 8.4–10.5)
Creatinine, Ser: 1.6 mg/dL — ABNORMAL HIGH (ref 0.50–1.35)
GFR calc non Af Amer: 43 mL/min — ABNORMAL LOW (ref >90)
Sodium: 138 mEq/L (ref 135–145)

## 2013-02-05 LAB — SURGICAL PCR SCREEN: MRSA, PCR: POSITIVE — AB

## 2013-02-05 SURGERY — NEPHROLITHOTOMY PERCUTANEOUS
Anesthesia: General | Laterality: Right

## 2013-02-05 MED ORDER — METFORMIN HCL 500 MG PO TABS: 500.0000 mg | ORAL_TABLET | Freq: Two times a day (BID) | ORAL | Status: DC

## 2013-02-05 MED ORDER — CIPROFLOXACIN IN D5W 400 MG/200ML IV SOLN
INTRAVENOUS | Status: AC
  Filled 2013-02-05: qty 200

## 2013-02-05 MED ORDER — LISINOPRIL 10 MG PO TABS
10.0000 mg | ORAL_TABLET | Freq: Every morning | ORAL | Status: DC
  Administered 2013-02-06: 10 mg via ORAL
  Filled 2013-02-05: qty 1

## 2013-02-05 MED ORDER — CEFTRIAXONE SODIUM 1 G IJ SOLR
1.0000 g | INTRAMUSCULAR | Status: DC
  Administered 2013-02-05: 1 g via INTRAVENOUS
  Filled 2013-02-05 (×3): qty 10

## 2013-02-05 MED ORDER — MUPIROCIN 2 % EX OINT: TOPICAL_OINTMENT | Freq: Two times a day (BID) | CUTANEOUS | Status: DC

## 2013-02-05 MED ORDER — MIDAZOLAM HCL 5 MG/5ML IJ SOLN
INTRAMUSCULAR | Status: DC | PRN
  Administered 2013-02-05 (×2): .5 mg via INTRAVENOUS

## 2013-02-05 MED ORDER — IOHEXOL 300 MG/ML  SOLN
INTRAMUSCULAR | Status: DC | PRN
  Administered 2013-02-05: 20 mL

## 2013-02-05 MED ORDER — CHLORHEXIDINE GLUCONATE CLOTH 2 % EX PADS
6.0000 | MEDICATED_PAD | Freq: Every day | CUTANEOUS | Status: DC
  Administered 2013-02-06: 6 via TOPICAL

## 2013-02-05 MED ORDER — OXYCODONE-ACETAMINOPHEN 5-325 MG PO TABS
1.0000 | ORAL_TABLET | ORAL | Status: DC | PRN
  Administered 2013-02-05 – 2013-02-06 (×2): 2 via ORAL
  Filled 2013-02-05 (×2): qty 2

## 2013-02-05 MED ORDER — FENTANYL CITRATE 0.05 MG/ML IJ SOLN
INTRAMUSCULAR | Status: DC | PRN
  Administered 2013-02-05 (×2): 25 ug via INTRAVENOUS
  Administered 2013-02-05: 50 ug via INTRAVENOUS

## 2013-02-05 MED ORDER — CIPROFLOXACIN IN D5W 400 MG/200ML IV SOLN
400.0000 mg | INTRAVENOUS | Status: AC
  Administered 2013-02-05: 400 mg via INTRAVENOUS

## 2013-02-05 MED ORDER — TETRAHYDROZOLINE HCL 0.05 % OP SOLN
1.0000 [drp] | Freq: Every day | OPHTHALMIC | Status: DC | PRN
  Filled 2013-02-05: qty 15

## 2013-02-05 MED ORDER — ATENOLOL 50 MG PO TABS
50.0000 mg | ORAL_TABLET | Freq: Every morning | ORAL | Status: DC
  Administered 2013-02-06: 50 mg via ORAL
  Filled 2013-02-05: qty 1

## 2013-02-05 MED ORDER — GABAPENTIN 300 MG PO CAPS
600.0000 mg | ORAL_CAPSULE | Freq: Every day | ORAL | Status: DC
  Administered 2013-02-05: 600 mg via ORAL
  Filled 2013-02-05 (×2): qty 2

## 2013-02-05 MED ORDER — FENTANYL CITRATE 0.05 MG/ML IJ SOLN
INTRAMUSCULAR | Status: AC
  Filled 2013-02-05: qty 2

## 2013-02-05 MED ORDER — SIMVASTATIN 20 MG PO TABS
20.0000 mg | ORAL_TABLET | Freq: Every day | ORAL | Status: DC
  Administered 2013-02-05: 20 mg via ORAL
  Filled 2013-02-05 (×2): qty 1

## 2013-02-05 MED ORDER — PROMETHAZINE HCL 25 MG/ML IJ SOLN: 6.2500 mg | INTRAMUSCULAR | Status: DC | PRN

## 2013-02-05 MED ORDER — DOCUSATE SODIUM 100 MG PO CAPS
100.0000 mg | ORAL_CAPSULE | Freq: Two times a day (BID) | ORAL | Status: DC
  Administered 2013-02-06: 100 mg via ORAL
  Filled 2013-02-05 (×3): qty 1

## 2013-02-05 MED ORDER — IOHEXOL 300 MG/ML  SOLN
INTRAMUSCULAR | Status: AC
  Filled 2013-02-05: qty 1

## 2013-02-05 MED ORDER — BELLADONNA ALKALOIDS-OPIUM 16.2-60 MG RE SUPP
1.0000 | Freq: Four times a day (QID) | RECTAL | Status: DC | PRN
  Administered 2013-02-05: 1 via RECTAL

## 2013-02-05 MED ORDER — LORAZEPAM 0.5 MG PO TABS: 0.5000 mg | ORAL_TABLET | Freq: Three times a day (TID) | ORAL | Status: DC | PRN

## 2013-02-05 MED ORDER — PROPOFOL 10 MG/ML IV BOLUS
INTRAVENOUS | Status: DC | PRN
  Administered 2013-02-05: 25 mg via INTRAVENOUS
  Administered 2013-02-05: 100 mg via INTRAVENOUS

## 2013-02-05 MED ORDER — LIDOCAINE HCL 4 % MT SOLN
OROMUCOSAL | Status: DC | PRN
  Administered 2013-02-05: 4 mL via TOPICAL

## 2013-02-05 MED ORDER — NEOSTIGMINE METHYLSULFATE 1 MG/ML IJ SOLN
INTRAMUSCULAR | Status: DC | PRN
  Administered 2013-02-05: 3 mg via INTRAVENOUS

## 2013-02-05 MED ORDER — MEPERIDINE HCL 50 MG/ML IJ SOLN: 6.2500 mg | INTRAMUSCULAR | Status: DC | PRN

## 2013-02-05 MED ORDER — GLYCOPYRROLATE 0.2 MG/ML IJ SOLN
INTRAMUSCULAR | Status: DC | PRN
  Administered 2013-02-05: .6 mg via INTRAVENOUS

## 2013-02-05 MED ORDER — MUPIROCIN 2 % EX OINT
TOPICAL_OINTMENT | CUTANEOUS | Status: AC
  Administered 2013-02-05: 1
  Filled 2013-02-05: qty 22

## 2013-02-05 MED ORDER — BELLADONNA ALKALOIDS-OPIUM 16.2-60 MG RE SUPP
RECTAL | Status: AC
  Filled 2013-02-05: qty 1

## 2013-02-05 MED ORDER — CISATRACURIUM BESYLATE (PF) 10 MG/5ML IV SOLN
INTRAVENOUS | Status: DC | PRN
  Administered 2013-02-05: 6 mg via INTRAVENOUS
  Administered 2013-02-05: 2 mg via INTRAVENOUS

## 2013-02-05 MED ORDER — GLIPIZIDE ER 5 MG PO TB24
5.0000 mg | ORAL_TABLET | Freq: Every day | ORAL | Status: DC
Start: 2013-02-06 — End: 2013-02-06
  Administered 2013-02-06: 5 mg via ORAL
  Filled 2013-02-05 (×2): qty 1

## 2013-02-05 MED ORDER — SUCCINYLCHOLINE CHLORIDE 20 MG/ML IJ SOLN
INTRAMUSCULAR | Status: DC | PRN
  Administered 2013-02-05: 100 mg via INTRAVENOUS

## 2013-02-05 MED ORDER — MUPIROCIN 2 % EX OINT
1.0000 "application " | TOPICAL_OINTMENT | Freq: Two times a day (BID) | CUTANEOUS | Status: DC
  Administered 2013-02-05 – 2013-02-06 (×2): 1 via NASAL

## 2013-02-05 MED ORDER — CIPROFLOXACIN IN D5W 400 MG/200ML IV SOLN: INTRAVENOUS | Status: DC | PRN

## 2013-02-05 MED ORDER — STERILE WATER FOR IRRIGATION IR SOLN
Status: DC | PRN
  Administered 2013-02-05: 7000 mL

## 2013-02-05 MED ORDER — SODIUM CHLORIDE 0.45 % IV SOLN
INTRAVENOUS | Status: DC
  Administered 2013-02-05: 19:00:00 via INTRAVENOUS

## 2013-02-05 MED ORDER — HYDROMORPHONE HCL PF 1 MG/ML IJ SOLN: 0.5000 mg | INTRAMUSCULAR | Status: DC | PRN

## 2013-02-05 MED ORDER — ONDANSETRON HCL 4 MG/2ML IJ SOLN: 4.0000 mg | INTRAMUSCULAR | Status: DC | PRN

## 2013-02-05 MED ORDER — FAMOTIDINE 10 MG PO TABS
10.0000 mg | ORAL_TABLET | Freq: Every day | ORAL | Status: DC
  Administered 2013-02-06: 10 mg via ORAL
  Filled 2013-02-05: qty 1

## 2013-02-05 MED ORDER — EPHEDRINE SULFATE 50 MG/ML IJ SOLN
INTRAMUSCULAR | Status: DC | PRN
  Administered 2013-02-05 (×6): 5 mg via INTRAVENOUS

## 2013-02-05 MED ORDER — FENTANYL CITRATE 0.05 MG/ML IJ SOLN
25.0000 ug | INTRAMUSCULAR | Status: DC | PRN
  Administered 2013-02-05: 25 ug via INTRAVENOUS

## 2013-02-05 MED ORDER — ONDANSETRON HCL 4 MG/2ML IJ SOLN
INTRAMUSCULAR | Status: DC | PRN
  Administered 2013-02-05 (×2): 2 mg via INTRAVENOUS

## 2013-02-05 MED ORDER — LACTATED RINGERS IV SOLN
INTRAVENOUS | Status: DC
  Administered 2013-02-05: 1000 mL via INTRAVENOUS
  Administered 2013-02-05 (×2): via INTRAVENOUS

## 2013-02-05 MED ORDER — SODIUM CHLORIDE 0.9 % IR SOLN
Status: DC | PRN
  Administered 2013-02-05: 6000 mL

## 2013-02-05 SURGICAL SUPPLY — 66 items
ADAPTER CATH URET PLST 4-6FR (CATHETERS) ×1 IMPLANT
ADPR CATH URET STRL DISP 4-6FR (CATHETERS)
APL SKNCLS STERI-STRIP NONHPOA (GAUZE/BANDAGES/DRESSINGS) ×1
BAG URINE DRAINAGE (UROLOGICAL SUPPLIES) ×3 IMPLANT
BAG URO CATCHER STRL LF (DRAPE) ×2 IMPLANT
BASKET ZERO TIP NITINOL 2.4FR (BASKET) ×2 IMPLANT
BENZOIN TINCTURE PRP APPL 2/3 (GAUZE/BANDAGES/DRESSINGS) ×3 IMPLANT
BLADE SURG 15 STRL LF DISP TIS (BLADE) ×1 IMPLANT
BLADE SURG 15 STRL SS (BLADE) ×2
BSKT STON RTRVL ZERO TP 2.4FR (BASKET) ×1
CATCHER STONE W/TUBE ADAPTER (UROLOGICAL SUPPLIES) ×1 IMPLANT
CATH BEACON 5.038 65CM KMP-01 (CATHETERS) ×1 IMPLANT
CATH FOLEY 2W COUNCIL 20FR 5CC (CATHETERS) IMPLANT
CATH FOLEY 2WAY SLVR  5CC 16FR (CATHETERS) ×1
CATH FOLEY 2WAY SLVR  5CC 18FR (CATHETERS) ×1
CATH FOLEY 2WAY SLVR 5CC 16FR (CATHETERS) IMPLANT
CATH FOLEY 2WAY SLVR 5CC 18FR (CATHETERS) IMPLANT
CATH ROBINSON RED A/P 20FR (CATHETERS) IMPLANT
CATH ULTRATHANE 14FR (STENTS) ×1 IMPLANT
CATH URET 5FR 28IN CONE TIP (BALLOONS)
CATH URET 5FR 70CM CONE TIP (BALLOONS) IMPLANT
CATH URET WHISTLE 5FR 28IN (CATHETERS) IMPLANT
CATH URET WHISTLE 6FR (CATHETERS) IMPLANT
CATH X-FORCE N30 NEPHROSTOMY (TUBING) ×1 IMPLANT
CLOTH BEACON ORANGE TIMEOUT ST (SAFETY) ×2 IMPLANT
COVER SURGICAL LIGHT HANDLE (MISCELLANEOUS) ×2 IMPLANT
DRAPE C-ARM 42X120 X-RAY (DRAPES) ×2 IMPLANT
DRAPE CAMERA CLOSED 9X96 (DRAPES) ×3 IMPLANT
DRAPE LINGEMAN PERC (DRAPES) ×2 IMPLANT
DRAPE SURG IRRIG POUCH 19X23 (DRAPES) ×2 IMPLANT
DRAPE TABLE BACK 44X90 PK DISP (DRAPES) ×1 IMPLANT
DRSG TEGADERM 8X12 (GAUZE/BANDAGES/DRESSINGS) ×2 IMPLANT
FORCEPS BIOP 2.4F 115CM BACKLD (INSTRUMENTS) ×1 IMPLANT
GLOVE BIOGEL M 8.0 STRL (GLOVE) ×2 IMPLANT
GOWN PREVENTION PLUS XLARGE (GOWN DISPOSABLE) ×1 IMPLANT
GOWN STRL NON-REIN LRG LVL3 (GOWN DISPOSABLE) ×2 IMPLANT
GOWN STRL REIN XL XLG (GOWN DISPOSABLE) ×4 IMPLANT
GUIDEWIRE AMPLAZ .035X145 (WIRE) ×2 IMPLANT
KIT BASIN OR (CUSTOM PROCEDURE TRAY) ×3 IMPLANT
LASER FIBER DISP (UROLOGICAL SUPPLIES) IMPLANT
LASER FIBER DISP 1000U (UROLOGICAL SUPPLIES) IMPLANT
MANIFOLD NEPTUNE II (INSTRUMENTS) ×2 IMPLANT
NS IRRIG 1000ML POUR BTL (IV SOLUTION) ×1 IMPLANT
PACK BASIC VI WITH GOWN DISP (CUSTOM PROCEDURE TRAY) ×2 IMPLANT
PACK CYSTO (CUSTOM PROCEDURE TRAY) ×1 IMPLANT
PAD ABD 7.5X8 STRL (GAUZE/BANDAGES/DRESSINGS) ×2 IMPLANT
PROBE LITHOCLAST ULTRA 3.8X403 (UROLOGICAL SUPPLIES) IMPLANT
PROBE PNEUMATIC 1.0MMX570MM (UROLOGICAL SUPPLIES) ×1 IMPLANT
SET CYSTO W/LG BORE CLAMP LF (SET/KITS/TRAYS/PACK) ×1 IMPLANT
SET IRRIG Y TYPE TUR BLADDER L (SET/KITS/TRAYS/PACK) ×3 IMPLANT
SET WARMING FLUID IRRIGATION (MISCELLANEOUS) ×1 IMPLANT
SHEATH PEELAWAY SET 9 (SHEATH) ×1 IMPLANT
SPONGE GAUZE 4X4 12PLY (GAUZE/BANDAGES/DRESSINGS) ×2 IMPLANT
SPONGE LAP 4X18 X RAY DECT (DISPOSABLE) ×1 IMPLANT
STENT CONTOUR 7FRX24X.038 (STENTS) ×1 IMPLANT
STONE CATCHER W/TUBE ADAPTER (UROLOGICAL SUPPLIES) IMPLANT
SUT SILK 2 0 30  PSL (SUTURE) ×1
SUT SILK 2 0 30 PSL (SUTURE) ×1 IMPLANT
SYR 20CC LL (SYRINGE) ×3 IMPLANT
SYRINGE 10CC LL (SYRINGE) ×2 IMPLANT
TAPE CLOTH SURG 4X10 WHT LF (GAUZE/BANDAGES/DRESSINGS) ×1 IMPLANT
TRAY FOLEY BAG SILVER LF 14FR (CATHETERS) ×2 IMPLANT
TRAY FOLEY CATH 14FRSI W/METER (CATHETERS) ×2 IMPLANT
TUBING CONNECTING 10 (TUBING) ×5 IMPLANT
WATER STERILE IRR 1500ML POUR (IV SOLUTION) ×1 IMPLANT
WIRE COONS/BENSON .038X145CM (WIRE) ×1 IMPLANT

## 2013-02-05 NOTE — Transfer of Care (Signed)
Immediate Anesthesia Transfer of Care Note  Patient: Frank Delacruz  Procedure(s) Performed: Procedure(s) with comments: NEPHROLITHOTOMY PERCUTANEOUS (Right) - RIGHT URETEROSCOPY WITH FLEXIBLE SCOPE THROUGH NEPHROSTOMY TRACT  CYSTOSCOPY FLEXIBLE (Right) URETEROSCOPY WITH BIOPSIES WITH STENT PLACEMENT (Right)  Patient Location: PACU  Anesthesia Type:General  Level of Consciousness: awake, alert , patient cooperative, confused and responds to stimulation  Airway & Oxygen Therapy: Patient Spontanous Breathing and Patient connected to face mask oxygen  Post-op Assessment: Report given to PACU RN, Post -op Vital signs reviewed and stable and Patient moving all extremities  Post vital signs: Reviewed and stable  Complications: No apparent anesthesia complications

## 2013-02-05 NOTE — Progress Notes (Signed)
PHARMACIST - PHYSICIAN COMMUNICATION DR:  Retta Diones CONCERNING:  METFORMIN SAFE ADMINISTRATION POLICY  RECOMMENDATION: Metformin has been placed on DISCONTINUE (rejected order) STATUS and should be reordered only after any of the conditions below are ruled out.  Current safety recommendations include avoiding metformin for a minimum of 48 hours after the patient's exposure to intravenous contrast media.  DESCRIPTION:  The Pharmacy Committee has adopted a policy that restricts the use of metformin in hospitalized patients until all the contraindications to administration have been ruled out. Specific contraindications are: [x]  Serum creatinine ? 1.5 for males []  Serum creatinine ? 1.4 for females []  Shock, acute MI, sepsis, hypoxemia, dehydration []  Planned administration of intravenous iodinated contrast media []  Heart Failure patients with low EF []  Acute or chronic metabolic acidosis (including DKA)     Juliette Alcide, PharmD, BCPS.   Pager: [phone] 02/05/2013 6:49 PM

## 2013-02-05 NOTE — Preoperative (Signed)
Beta Blockers   Reason not to administer Beta Blockers :Not Applicable took beta blocker this a.m. 

## 2013-02-05 NOTE — H&P (Signed)
Urology History and Physical Exam  CC: Right kidney blocked  HPI: 67 year old male presents for ureteroscopic examination of his right ureter through a percutaneous tract. 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 He recently presented with an elevating creatinine. As part of his routine surveillance, I attempted cystoscopy and ureteroscopy on the right in the spring of 2014. This was unsuccessful. The patient has had a recent nephrostomy tube placed because of hydronephrosis, and he is undergoing ureteroscopic evaluation of the ureter to assess whether this is a benign stricture or recurrent urothelial carcinoma, which would eventually necessitate probable nephroureterostomy.   PMH: Past Medical History  Diagnosis Date  . Hyperlipidemia, mild   . Increased prostate specific antigen (PSA) velocity   . Hypertension   . H/O hiatal hernia   . GERD (gastroesophageal reflux disease) occasionlly takes zantac  . Arthritis of shoulder region, left     lt. shoulder  . History of kidney stones   . Frequency of urination   . Urgency of urination   . Nocturia   . Hematuria   . BPH (benign prostatic hypertrophy)   . Dysuria   . Incomplete emptying of bladder   . History of acute renal failure ADMISSION  04-11-2012  . Diabetes mellitus without complication     new diagnosis 12/13  . Headache(784.0)   . Meningitis spinal 1949  . 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 Anterior cervical decomp/discectomy fusion  03-09-2004    C5 - 6/ plates and screws retained  . Echo/ cardiology stress test  04-22-2009  DR [facility]    NORMAL  . Ureterolithotomy  MARCH 2012-- [facility]  . Cysto/  resection bladder lesion with bx  JULY 2012-- [facility]  . Rotator cuff repair  1997    LEFT  .  Inguinal hernia repair  2006    RIGHT  . Cystoscopy w/ retrogrades  11/21/2011    Procedure: CYSTOSCOPY WITH RETROGRADE PYELOGRAM;  Surgeon: Marcine Matar, MD;  Location: [facility];  Service: Urology;  Laterality: Bilateral;  BLADDER BX  1 hour requested for this case    . Ureteral reimplantion  01/14/2012    Procedure: URETERAL REIMPLANT;  Surgeon: Marcine Matar, MD;  Location: [facility];  Service: Urology;  Laterality: Right;  RIGHT SEGMENTAL URETERECTOMY WITH RIGHT URETERAL REIMPLANT    . Ureterectomy  01/14/2012    Procedure: URETERECTOMY;  Surgeon: Marcine Matar, MD;  Location: [facility];  Service: Urology;  Laterality: Right;  . Transurethral resection of bladder tumor  09/05/2012    Procedure: TRANSURETHRAL RESECTION OF BLADDER TUMOR (TURBT);  Surgeon: Marcine Matar, MD;  Location: [facility];  Service: Urology;  Laterality: N/A;  90 MIN CYSTO, TURBT, left RETROGRADE PYELOGRAM, TUIP,  NO STENTS   . Transurethral incision of prostate  09/05/2012    Procedure: TRANSURETHRAL INCISION OF THE PROSTATE (TUIP);  Surgeon: Marcine Matar, MD;  Location: [facility];  Service: Urology;  Laterality: N/A;  . Cystoscopy w/ retrogrades  09/05/2012    Procedure: CYSTOSCOPY WITH RETROGRADE PYELOGRAM;  Surgeon: Marcine Matar, MD;  Location: [facility];  Service: Urology;  Laterality: Left;    Allergies: Allergies  Allergen Reactions  . Meloxicam Nausea And Vomiting  . Naproxen Nausea And Vomiting  Medications: No prescriptions prior to admission     Social History: History   Social History  . Marital Status: Married    Spouse Name: N/A    Number of Children: N/A  . Years of Education: N/A   Occupational History  . Not on file.   Social History Main Topics  . Smoking status: Never Smoker   . Smokeless tobacco: Current User    Types: Chew     Comment: CHEWED TOBACCO FOR APPROX. 50 YRS - STATES HAS  CUT-DOWN SINCE MARCH 2012  . Alcohol Use: No  . Drug Use: No  . Sexually Active: Not on file   Other Topics Concern  . Not on file   Social History Narrative  . No narrative on file    Family History: Family History  Problem Relation Age of Onset  . Stroke Mother   . Cancer Father     Review of Systems: Positive: Right flank discomfort Negative:   A further 10 point review of systems was negative except what is listed in the HPI.  Physical Exam: @VITALS2 @ General: No acute distress.  Awake. Head:  Normocephalic.  Atraumatic. ENT:  EOMI.  Mucous membranes moist Neck:  Supple.  No lymphadenopathy. CV:  S1 present. S2 present. Regular rate. Pulmonary: Equal effort bilaterally.  Clear to auscultation bilaterally. Abdomen: Soft.  Non- tender to palpation. Skin:  Normal turgor.  No visible rash. Extremity: No gross deformity of bilateral upper extremities.  No gross deformity of    bilateral lower extremities. Neurologic: Alert. Appropriate mood.   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

## 2013-02-05 NOTE — Op Note (Signed)
Preoperative diagnosis: Right-sided hydronephrosis with history of urothelial carcinoma of the bladder and ureter  Postoperative diagnosis: Right-sided hydronephrosis with evidence of recurrent urothelial carcinoma in the right ureter, and recurrent bladder tumors   Procedure: Flexible cystoscopy, right ureteroscopy through percutaneous tract, right ureteral biopsies, right double-J stent placement, interpretive fluoroscopy, cystoscopy, transurethral resection of bladder tumors-aggregate diameter 2 cm  Surgeon: Bertram Millard. Micaiah Litle, M.D.   Anesthesia: Gen.   Complications: None  Specimen(s): 1. Right renal washings for cytology 2. Right ureteral biopsies 3. Biopsies of right bladder wall 4. Right trigonal biopsies 5. Anterior bladder biopsies   Drain(s):18 French Foley catheter per urethra, 14 French pigtail catheter for nephrostomy tube  Indications:This 67 year old male presents at this time for cystoscopy and right ureteroscopy. His history dates back 2013 when he was found to have a ureteral stone by Dr. Baldo Ash in [place], [place]. Ureteroscopy was performed and this revealed the stone within the distal ureteral tumor. Proximally, there was no ureteral tumor. Dr. Baldo Ash left the area, and the patient came to see me for second opinion.  After ureteroscopy by myself, he underwent right distal ureterectomy for urothelial carcinoma of his ureter. This was done on 01/21/2012. Pathology revealed high-grade noninvasive urothelial carcinoma, with carcinoma in situ present at a margin. There was no papillary lesion at the margin however.  He had a double-J stent left in postoperatively. He was given 4 postoperative BCG treatments, the last on 04/11/2012.  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 Sputum smears were negative for AFB. He was found to have pneumonia, and treated appropriately with antitubercular drugs. Eventually, acid fast bacilli grew  out in culture. He has recovered from that, but obviously has not undertaken further BCG therapy. The stent was removed in September 2013. He subsequently underwent cystoscopy, left retrograde ureteropyelogram, attempted right ureteropyelogram and bladder biopsy in January, 2014. Left renal washings revealed atypical cells, but there was significant fungal infection present. Retrograde was un remarkable. Bladder tumor revealed noninvasive urothelial carcinoma. I was unable to identify the right ureteral orifice to his prior reimplant.  He was recently found to have right hydronephrosis, and it was felt that the patient probably had distal obstruction either from stricture or recurrent urothelial carcinoma. He presents at this time for cystoscopy, ureteroscopy through his nephrostomy tract. Risks and complications have been discussed with the patient. He understands these and desires to proceed.   Technique and findings: The patient was properly identified and marked in the holding area. He received preoperative IV antibiotics. He was taken to the operating room where general anesthetic was administered with the endotracheal apparatus. He was placed in the recumbent position. Genitalia and perineum were prepped and draped. Timeout was then performed. I then passed a flexible cystoscope transurethrally, the urethra was normal. Bladder neck was open, indicative of prior TUR bladder neck. Bladder was inspected circumferentially. There was a small papillary tumor in the right trigonal region and area consistent with a prior ureteral orifice. There was also some papillary neoplasm anteriorly, approximately 2 and these were about 2 cm in aggregate diameter. There was heavy trabeculation and cellules. Since this was a flexible procedure, I felt that we with eventually need to convert this to rigid cystoscopy and resection of bladder tumor following his ureteroscopy. The scope was then removed, and I placed a 16 French  Foley catheter and hooked this to dependent drainage. The patient was then placed in the prone position. All extremities were padded  appropriately. The right flank was prepped and draped around his pigtail catheter. I then placed floppy tip guidewire through the pigtail catheter and under fluoroscopic guidance removed the pigtail catheter. A Kumpe catheter was used to negotiate the UPJ, and I guided the guidewire down the ureter and easily into the bladder were curl was seen fluoroscopically. I then removed the Kumpe catheter and placed the peel-away catheter over the guidewire. The core was removed, and a second guidewire was negotiated in the bladder under fluoroscopic guidance. The peel-away catheter was removed. I then placed a NephroMax balloon over top of the working guidewire, dilated the access channel with the balloon at 16 atmospheres of pressure. A 24 French sheath was then placed over top of this into the renal pelvis using fluoroscopic guidance. I removed the balloon. Over the working guidewire, I passed the digital ureteroscope into the ureter quite easily. This passed all the way into the bladder without difficulty. Throughout the ureter, it was obvious that there was papillary neoplasm. I negotiated the entire ureter, and at least two thirds of the ureter had neoplasm. Prior to removal the pigtail catheter, I took renal washings with saline and sent this for cytology. I took several biopsies using the ureteral biopsy forceps and sent these is ureteral biopsies. I then removed the ureteroscope, and under fluoroscopic guidance placed a 24 cm, 7 French double-J catheter fluoroscopically. I also replaced a 14 [place] Cook catheter after the nephroscope sheath was removed, and coiled pigtail and and sutured into position.  After the pigtail catheter had been sutured in position and placed to dependent drainage, the patient was then placed in the recumbent position again. He was placed in the  dorsolithotomy position, genitalia and perineum were prepped and draped. I then passed a 22 French panendoscope through his urethra. There were no urethral lesions. Again, prostatic urethra was wide open. There were 3 areas of tumor that I noted. One was anterior, very difficult to access just to the left of the midline. The other was around the ureteral orifice which was just to the right of the midline at the dome. The third was a very small lesion in the right trigone. The right trigonal in the right bladder wall tumors were excised quite easily with cold cup biopsy forceps. They were sent separately as right bladder wall and right trigone, separately. The anterior bladder tumor was quite difficult to excise. This was about 2 cm in size. Because of the patient's body habitus, excisionally biopsied this as well as possible. This was sent as anterior bladder tumor. Any remaining papillary lesion was cauterized well with the Bugbee electrode, as were the 2 other biopsy sites. At this point, hemostasis was excellent. Further thorough inspection inspection of the bladder failed to reveal any other tumor. I then removed the scope and replaced an 22 French Foley catheter. This was hooked to dependent drainage.  The patient was awakened and taken to the PACU in stable condition. He tolerated the procedure well.

## 2013-02-05 NOTE — Anesthesia Postprocedure Evaluation (Signed)
  Anesthesia Post-op Note  Patient: Frank Delacruz  Procedure(s) Performed: Procedure(s) (LRB): NEPHROLITHOTOMY PERCUTANEOUS (Right) CYSTOSCOPY FLEXIBLE (Right) URETEROSCOPY WITH BIOPSIES WITH STENT PLACEMENT (Right)  Patient Location: PACU  Anesthesia Type: General  Level of Consciousness: awake and alert   Airway and Oxygen Therapy: Patient Spontanous Breathing  Post-op Pain: mild  Post-op Assessment: Post-op Vital signs reviewed, Patient's Cardiovascular Status Stable, Respiratory Function Stable, Patent Airway and No signs of Nausea or vomiting  Last Vitals:  Filed Vitals:   02/05/13 1058  BP: 110/67  Pulse: 51  Temp: 36.9 C  Resp: 18    Post-op Vital Signs: stable   Complications: No apparent anesthesia complications

## 2013-02-05 NOTE — Anesthesia Preprocedure Evaluation (Addendum)
Anesthesia Evaluation  Patient identified by MRN, date of birth, ID band Patient awake    Reviewed: Allergy & Precautions, H&P , NPO status , Patient's Chart, lab work & pertinent test results, reviewed documented beta blocker date and time   History of Anesthesia Complications Negative for: history of anesthetic complications  Airway Mallampati: III TM Distance: >3 FB Neck ROM: Full    Dental  (+) Edentulous Upper   Pulmonary neg pulmonary ROS,  breath sounds clear to auscultation        Cardiovascular hypertension, Pt. on medications and Pt. on home beta blockers negative cardio ROS  Rhythm:Regular     Neuro/Psych negative neurological ROS  negative psych ROS   GI/Hepatic negative GI ROS, Neg liver ROS, hiatal hernia, GERD-  Medicated,  Endo/Other  negative endocrine ROSdiabetes, Type 2, Oral Hypoglycemic AgentsMorbid obesity  Renal/GU Renal diseasenegative Renal ROS  negative genitourinary   Musculoskeletal negative musculoskeletal ROS (+)   Abdominal   Peds  Hematology negative hematology ROS (+)   Anesthesia Other Findings   Reproductive/Obstetrics negative OB ROS                           Anesthesia Physical  Anesthesia Plan  ASA: III  Anesthesia Plan: General   Post-op Pain Management:    Induction: Intravenous  Airway Management Planned: Oral ETT  Additional Equipment:   Intra-op Plan:   Post-operative Plan:   Informed Consent: I have reviewed the patients History and Physical, chart, labs and discussed the procedure including the risks, benefits and alternatives for the proposed anesthesia with the patient or authorized representative who has indicated his/her understanding and acceptance.   Dental advisory given  Plan Discussed with: CRNA  Anesthesia Plan Comments:        Anesthesia Quick Evaluation

## 2013-02-06 ENCOUNTER — Encounter (HOSPITAL_COMMUNITY): Payer: Self-pay | Admitting: Urology

## 2013-02-06 LAB — GLUCOSE, CAPILLARY

## 2013-02-06 MED ORDER — MUPIROCIN 2 % EX OINT: 1.0000 "application " | TOPICAL_OINTMENT | Freq: Two times a day (BID) | CUTANEOUS | Status: DC

## 2013-02-06 MED ORDER — SULFAMETHOXAZOLE-TRIMETHOPRIM 400-80 MG PO TABS: 1.0000 | ORAL_TABLET | Freq: Two times a day (BID) | ORAL | Status: DC

## 2013-02-06 MED ORDER — OXYCODONE-ACETAMINOPHEN 5-325 MG PO TABS: 1.0000 | ORAL_TABLET | ORAL | Status: DC | PRN

## 2013-02-06 NOTE — Care Management Note (Signed)
    Page 1 of 1   02/06/2013     8:59:17 AM   CARE MANAGEMENT NOTE 02/06/2013  Patient:  Frank Delacruz, Frank Delacruz   Account Number:  [id number]  Date Initiated:  02/06/2013  Documentation initiated by:  Lanier Clam  Subjective/Objective Assessment:   ADMITTED W/R KIDNEY BLOCKED.     Action/Plan:   FROM HOME.HAS PCP, PHARMACY.   Anticipated DC Date:  02/06/2013   Anticipated DC Plan:  HOME/SELF CARE      DC Planning Services  CM consult      Choice offered to / List presented to:             Status of service:  Completed, signed off Medicare Important Message given?   (If response is "NO", the following Medicare IM given date fields will be blank) Date Medicare IM given:   Date Additional Medicare IM given:    Discharge Disposition:  HOME/SELF CARE  Per UR Regulation:  Reviewed for med. necessity/level of care/duration of stay  If discussed at Long Length of Stay Meetings, dates discussed:    Comments:  02/06/13 Meghin Thivierge RN,BSN NCM [phone]/ PR DOUBLE J STENT.

## 2013-02-06 NOTE — Progress Notes (Signed)
Pt d/c home with wife. Condition stable. VSS. D/c instruction given wuith RX. Wife and pt verbalized understanding of instruction given. Medicated for pain level 6-8

## 2013-02-06 NOTE — Discharge Summary (Signed)
Patient ID: Frank Delacruz MRN: [id number] DOB/AGE: 1946-03-20 66 y.o.  Admit date: 02/05/2013 Discharge date: 02/06/2013  Primary Care Physician:  Josue Hector, MD  Discharge Diagnoses:   1. Urothelial carcinoma of the right ureter  2. Urothelial carcinoma the bladder  Consults:  None     Discharge Medications:   Medication List    STOP taking these medications       BC HEADACHE POWDER PO      TAKE these medications       atenolol 50 MG tablet  Commonly known as:  TENORMIN  Take 50 mg by mouth every morning.     gabapentin 300 MG capsule  Commonly known as:  NEURONTIN  Take 600 mg by mouth at bedtime.     glipiZIDE 5 MG 24 hr tablet  Commonly known as:  GLUCOTROL XL  Take 5 mg by mouth daily.     ICY HOT PAIN RELIEVING EX  Apply 1 application topically daily as needed (for pain).     lisinopril 10 MG tablet  Commonly known as:  PRINIVIL,ZESTRIL  Take 10 mg by mouth every morning.     LORazepam 0.5 MG tablet  Commonly known as:  ATIVAN  Take 0.5 mg by mouth every 8 (eight) hours as needed for anxiety.     metFORMIN 500 MG tablet  Commonly known as:  GLUCOPHAGE  Take 500 mg by mouth 2 (two) times daily.     mupirocin ointment 2 %  Commonly known as:  BACTROBAN  Apply 1 application topically 2 (two) times daily.     oxyCODONE-acetaminophen 5-325 MG per tablet  Commonly known as:  PERCOCET/ROXICET  Take 1-2 tablets by mouth every 4 (four) hours as needed.     simvastatin 20 MG tablet  Commonly known as:  ZOCOR  Take 20 mg by mouth every evening.     sulfamethoxazole-trimethoprim 400-80 MG per tablet  Commonly known as:  BACTRIM  Take 1 tablet by mouth 2 (two) times daily.     tadalafil 5 MG tablet  Commonly known as:  CIALIS  Take 5 mg by mouth daily as needed.     tetrahydrozoline 0.05 % ophthalmic solution  Place 1 drop into both eyes daily as needed (for dry eyes).     ZANTAC PO  Take 75 mg by mouth daily as needed (for heartburn).  For acid reflux         Significant Diagnostic Studies:  Ir Perc Nephrostomy Right  01/30/2013   *RADIOLOGY REPORT*  Clinical Data: Distal ureteral obstruction with hydronephrosis.  RIGHT PERCUTANEOUS NEPHROSTOMY CATHETER PLACEMENT UNDER ULTRASOUND AND FLUOROSCOPIC GUIDANCE  Technique: The procedure, risks (including but not limited to bleeding, infection, organ damage), benefits, and alternatives were explained to the patient.  Questions regarding the procedure were encouraged and answered.  The patient understands and consents to the procedure.  The rightflank region prepped with Betadine, draped in usual sterile fashion, infiltrated locally with 1% lidocaine.  As antibiotic prophylaxis, ciprofloxacin 400 mg IV was ordered pre- procedure and administered intravenously within one hour of incision.   Intravenous Fentanyl and Versed were administered as conscious sedation during continuous cardiorespiratory monitoring by the radiology RN, with a total moderate sedation time of 10 minutes.   Under real-time ultrasound guidance, a 21-gauge trocar needle was advanced into a posterior lower pole calyx. Ultrasound image documentation was saved. Urine spontaneously returned through the needle. Needle was exchanged over a guidewire for transitional dilator. Contrast injection confirmed appropriate positioning. Catheter was  exchanged over a guidewire for a 10 French pigtail catheter, formed centrally within the right renal collecting system. 20 ml of bloody purulent urine was aspirated, sent for routine Gram stain, culture and sensitivity.  Contrast injection confirms appropriate positioning and patency. Catheter   secured externally with 0 Prolene suture and placed to external drain bag. No immediate complication.  Fluoroscopy time: 18seconds  IMPRESSION Technically successful right percutaneous nephrostomy catheter placement.   Original Report Authenticated By: D. Andria Rhein, MD   Ir US Guide Bx  Asp/drain  01/30/2013   *RADIOLOGY REPORT*  Clinical Data: Distal ureteral obstruction with hydronephrosis.  RIGHT PERCUTANEOUS NEPHROSTOMY CATHETER PLACEMENT UNDER ULTRASOUND AND FLUOROSCOPIC GUIDANCE  Technique: The procedure, risks (including but not limited to bleeding, infection, organ damage), benefits, and alternatives were explained to the patient.  Questions regarding the procedure were encouraged and answered.  The patient understands and consents to the procedure.  The rightflank region prepped with Betadine, draped in usual sterile fashion, infiltrated locally with 1% lidocaine.  As antibiotic prophylaxis, ciprofloxacin 400 mg IV was ordered pre- procedure and administered intravenously within one hour of incision.   Intravenous Fentanyl and Versed were administered as conscious sedation during continuous cardiorespiratory monitoring by the radiology RN, with a total moderate sedation time of 10 minutes.   Under real-time ultrasound guidance, a 21-gauge trocar needle was advanced into a posterior lower pole calyx. Ultrasound image documentation was saved. Urine spontaneously returned through the needle. Needle was exchanged over a guidewire for transitional dilator. Contrast injection confirmed appropriate positioning. Catheter was exchanged over a guidewire for a 10 French pigtail catheter, formed centrally within the right renal collecting system. 20 ml of bloody purulent urine was aspirated, sent for routine Gram stain, culture and sensitivity.  Contrast injection confirms appropriate positioning and patency. Catheter   secured externally with 0 Prolene suture and placed to external drain bag. No immediate complication.  Fluoroscopy time: 18seconds  IMPRESSION Technically successful right percutaneous nephrostomy catheter placement.   Original Report Authenticated By: D. Andria Rhein, MD    Brief H and P: For complete details please refer to admission H and P, but in brief the patient is admitted  for ureteroscopy for further evaluation of possible recurrence of urothelial carcinoma in the right ureter. He has right hydronephrosis with a history of right distal ureterectomy and reimplantation for urothelial carcinoma of the right ureter. That was performed in mid-2013. He did have carcinoma in situ at the margin, and was put on BCG therapy following surgery. He became septic with BCG osis, and the ECG was stopped.  Hospital Course:  The patient was admitted directly to the operating room where he underwent right ureteroscopy, biopsy of right ureter, TURBT. Tolerated procedure well. Nephrostomy tube was removed postoperative day #1, as he did have the stent. His catheter was removed as well. He was discharged after voiding.  Day of Discharge BP 90/56  Pulse 62  Temp(Src) 98.6 F (37 C) (Oral)  Resp 16  Ht 5\' 6"  (1.676 m)  Wt 187 lb (84.823 kg)  BMI 30.2 kg/m2  SpO2 94%  Results for orders placed during the hospital encounter of 02/05/13 (from the past 24 hour(s))  SURGICAL PCR SCREEN     Status: Abnormal   Collection Time    02/05/13 10:53 AM      Result Value Range   MRSA, PCR POSITIVE (*) NEGATIVE   Staphylococcus aureus POSITIVE (*) NEGATIVE  CBC     Status: Abnormal   Collection Time  02/05/13 11:40 AM      Result Value Range   WBC 8.2  4.0 - 10.5 K/uL   RBC 4.22  4.22 - 5.81 MIL/uL   Hemoglobin 11.2 (*) 13.0 - 17.0 g/dL   HCT 09.6 (*) 04.5 - 40.9 %   MCV 87.0  78.0 - 100.0 fL   MCH 26.5  26.0 - 34.0 pg   MCHC 30.5  30.0 - 36.0 g/dL   RDW 81.1 (*) 91.4 - 78.2 %   Platelets 157  150 - 400 K/uL  BASIC METABOLIC PANEL     Status: Abnormal   Collection Time    02/05/13 11:40 AM      Result Value Range   Sodium 138  135 - 145 mEq/L   Potassium 4.6  3.5 - 5.1 mEq/L   Chloride 103  96 - 112 mEq/L   CO2 28  19 - 32 mEq/L   Glucose, Bld 101 (*) 70 - 99 mg/dL   BUN 24 (*) 6 - 23 mg/dL   Creatinine, Ser 9.56 (*) 0.50 - 1.35 mg/dL   Calcium 9.0  8.4 - 21.3 mg/dL   GFR  calc non Af Amer 43 (*) >90 mL/min   GFR calc Af Amer 49 (*) >90 mL/min  GLUCOSE, CAPILLARY     Status: None   Collection Time    02/05/13 11:42 AM      Result Value Range   Glucose-Capillary 94  70 - 99 mg/dL  GLUCOSE, CAPILLARY     Status: None   Collection Time    02/05/13  5:52 PM      Result Value Range   Glucose-Capillary 95  70 - 99 mg/dL   Comment 1 Documented in Chart     Comment 2 Notify RN      Physical Exam: General: Alert and awake oriented x3 not in any acute distress. HEENT: anicteric sclera, pupils reactive to light and accommodation CVS: S1-S2 clear no murmur rubs or gallops Chest: clear to auscultation bilaterally, no wheezing rales or rhonchi Abdomen: soft nontender, nondistended, normal bowel sounds, no organomegaly Extremities: no cyanosis, clubbing or edema noted bilaterally Neuro: Cranial nerves II-XII intact, no focal neurological deficits  Disposition:  Home  Diet:  No restrictions  Activity:  Discussed with patient   Disposition and Follow-up:    Will followup in the office, the patient does need a followup CT scan  TESTS THAT NEED FOLLOW-UP  Pathology will be reviewed  DISCHARGE FOLLOW-UP   Time spent on Discharge:  15 minutes  Signed: Chelsea Aus 02/06/2013, 6:56 AM

## 2013-02-17 ENCOUNTER — Other Ambulatory Visit: Payer: Self-pay | Admitting: Urology

## 2013-02-17 DIAGNOSIS — C679 Malignant neoplasm of bladder, unspecified: Secondary | ICD-10-CM

## 2013-02-19 ENCOUNTER — Ambulatory Visit (HOSPITAL_COMMUNITY)
Admission: RE | Admit: 2013-02-19 | Discharge: 2013-02-19 | Disposition: A | Discharge status: Home / Self Care | Payer: Medicare Other | Source: Ambulatory Visit | Attending: Urology | Admitting: Urology

## 2013-02-19 DIAGNOSIS — K5730 Diverticulosis of large intestine without perforation or abscess without bleeding: Secondary | ICD-10-CM | POA: Insufficient documentation

## 2013-02-19 DIAGNOSIS — C679 Malignant neoplasm of bladder, unspecified: Secondary | ICD-10-CM | POA: Insufficient documentation

## 2013-02-19 DIAGNOSIS — R9389 Abnormal findings on diagnostic imaging of other specified body structures: Secondary | ICD-10-CM | POA: Insufficient documentation

## 2013-02-19 DIAGNOSIS — R319 Hematuria, unspecified: Secondary | ICD-10-CM | POA: Insufficient documentation

## 2013-02-19 DIAGNOSIS — N134 Hydroureter: Secondary | ICD-10-CM | POA: Insufficient documentation

## 2014-01-25 IMAGING — CT CT ABD-PELV W/O CM
2 of 4 series · 15 of 46 positions shown, 17 images · non-contrast
Comparison: 09/19/2012

CLINICAL DATA: Bladder cancer, hematuria

CT ABDOMEN AND PELVIS WITHOUT CONTRAST
TECHNIQUE: Multidetector CT imaging of the abdomen and pelvis was
performed following the standard protocol without intravenous
contrast.

[Series 2: abdomen/pelvis w/o contrast · axial · non-contrast · 0.71mm/px · z∈[-514,-74]mm · 12 of 100 slices shown, 14 images]
[im 8/100  soft-tissue]
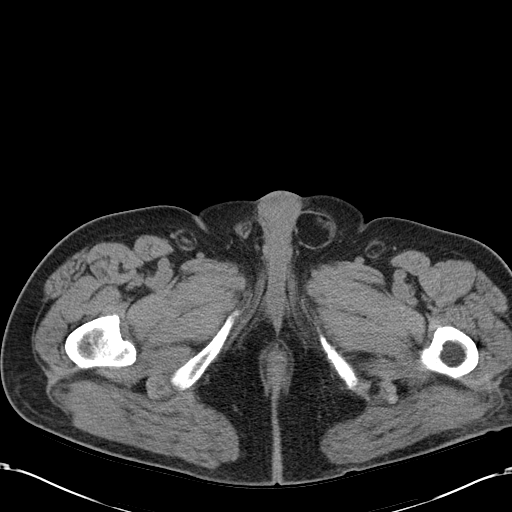
[im 8/100  bone]
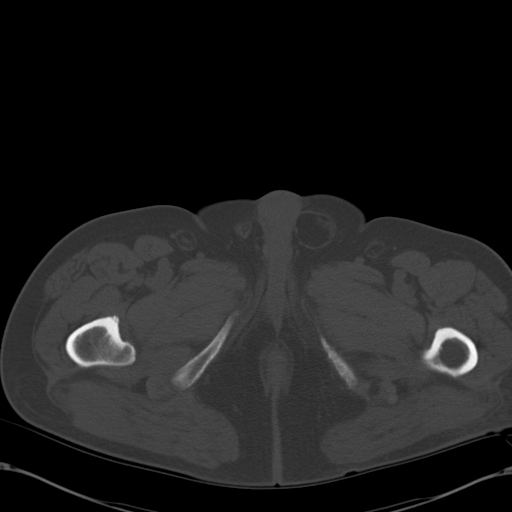
[im 16/100  soft-tissue]
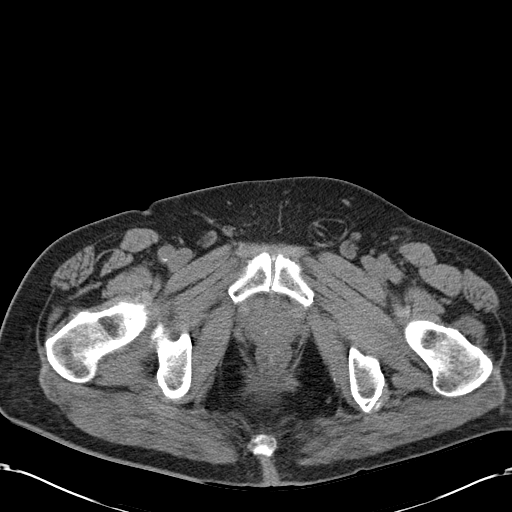
[im 24/100  soft-tissue]
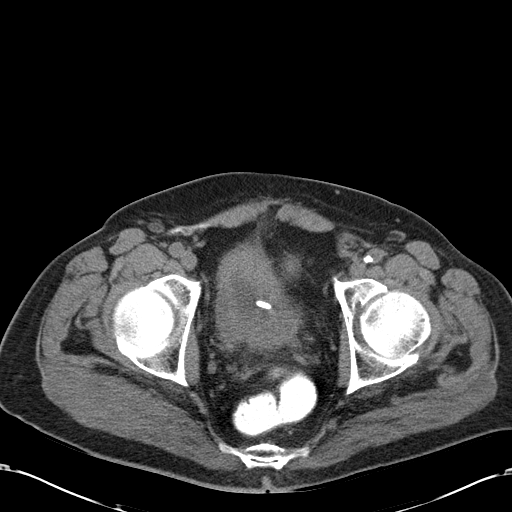
[im 32/100  soft-tissue]
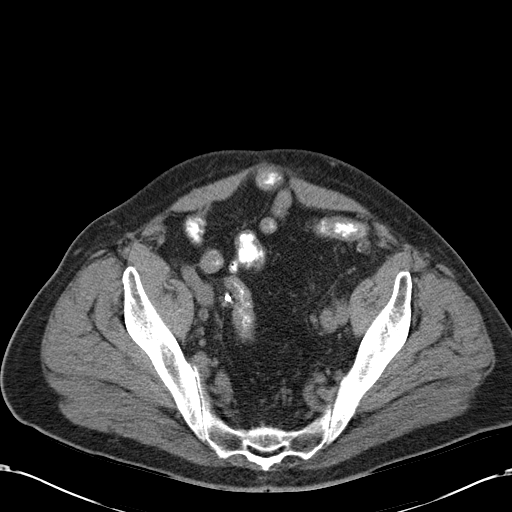
[im 40/100  soft-tissue]
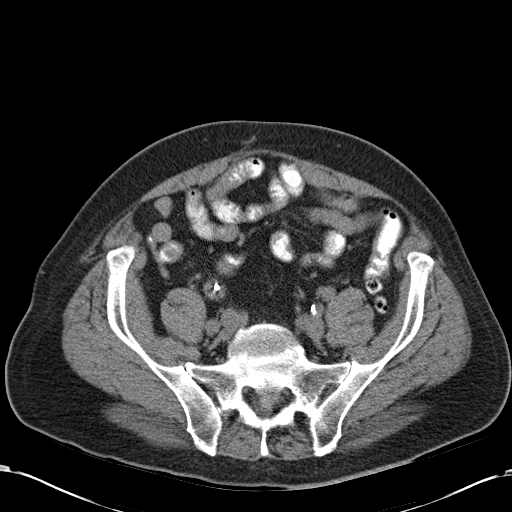
[im 48/100  soft-tissue]
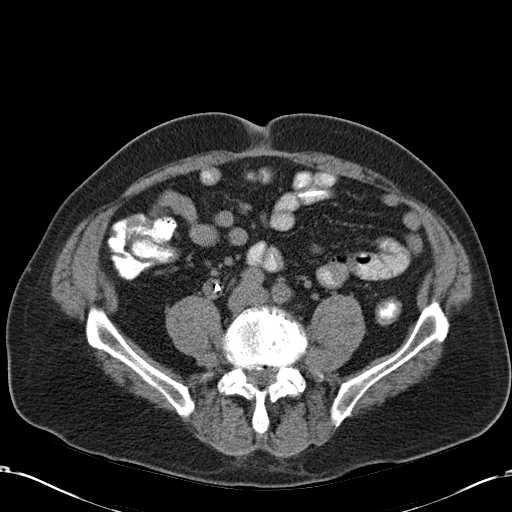
[im 56/100  soft-tissue]
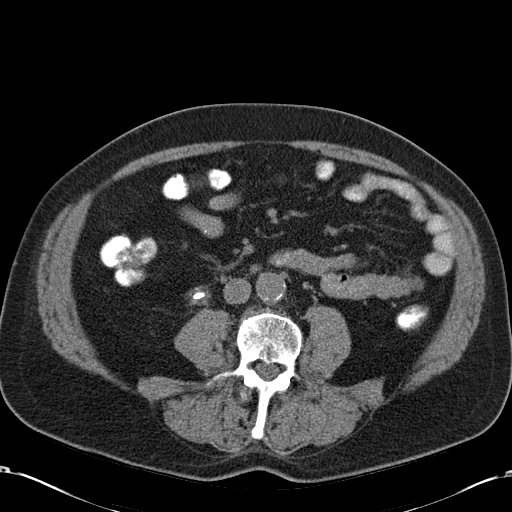
[im 64/100  soft-tissue]
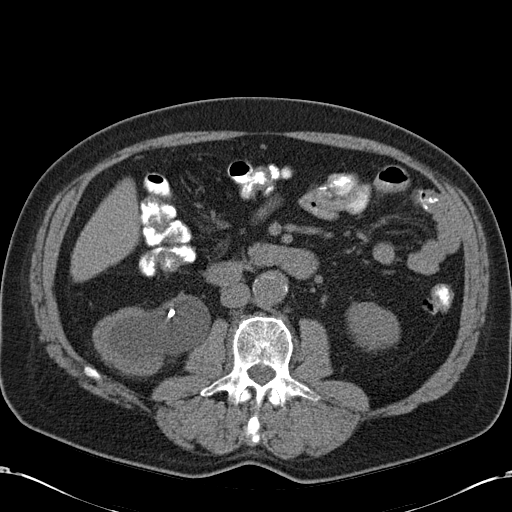
[im 72/100  soft-tissue]
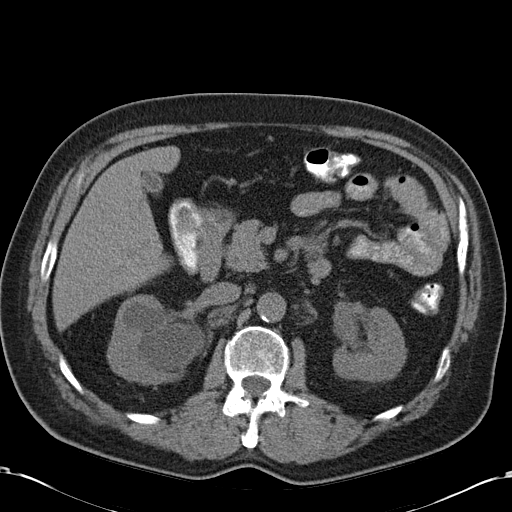
[im 72/100  bone]
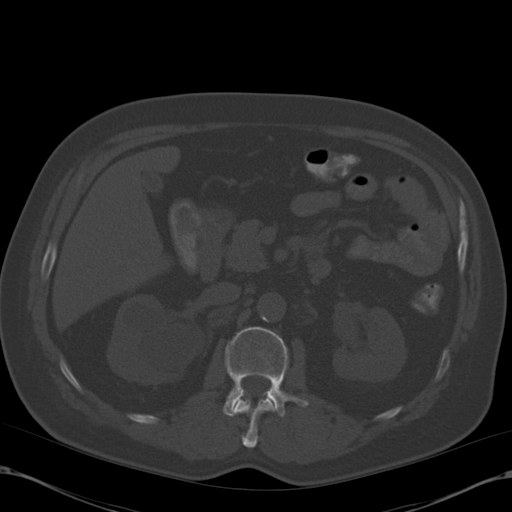
[im 80/100  soft-tissue]
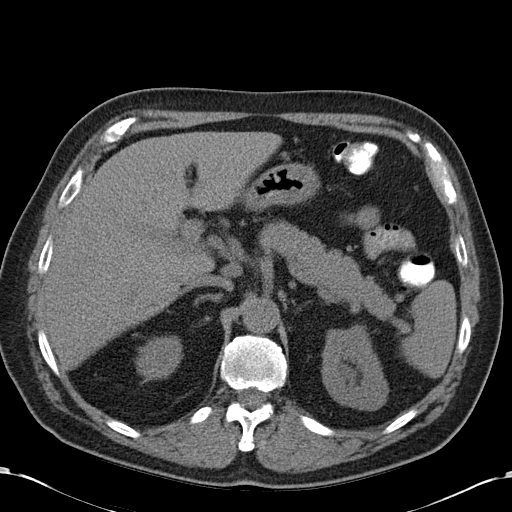
[im 88/100  soft-tissue]
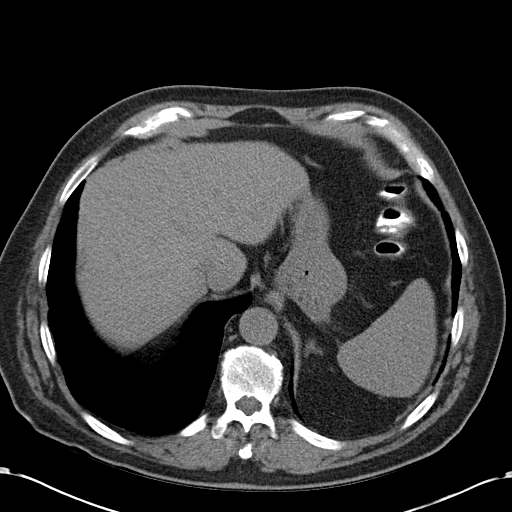
[im 96/100  soft-tissue]
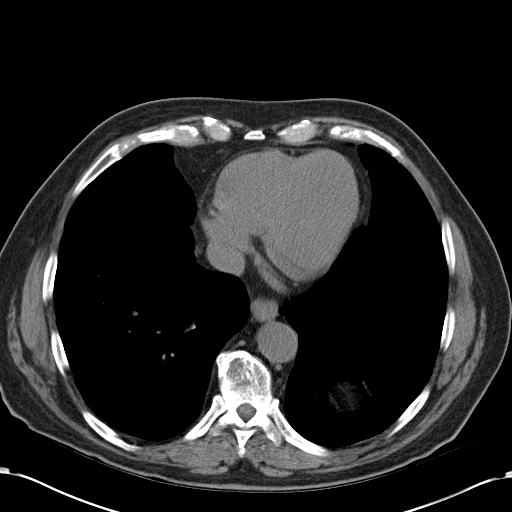

[Series 4: mpr cor (id) · coronal · 0.70mm/px · 3 of 90 slices shown]
[im 30/90  soft-tissue]
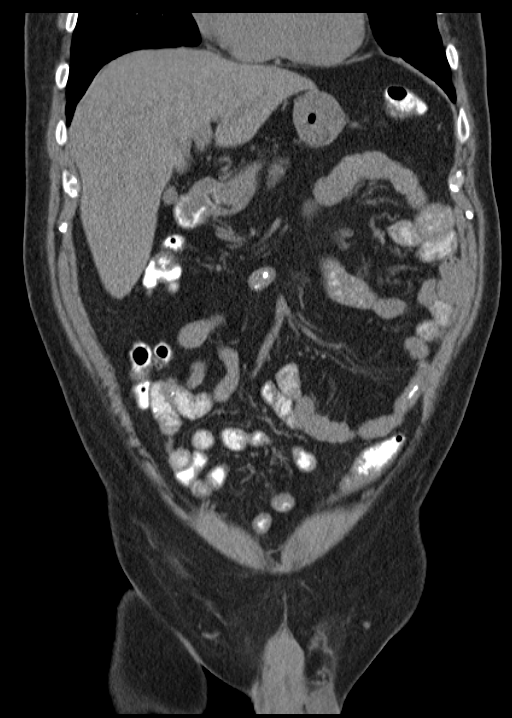
[im 40/90  soft-tissue]
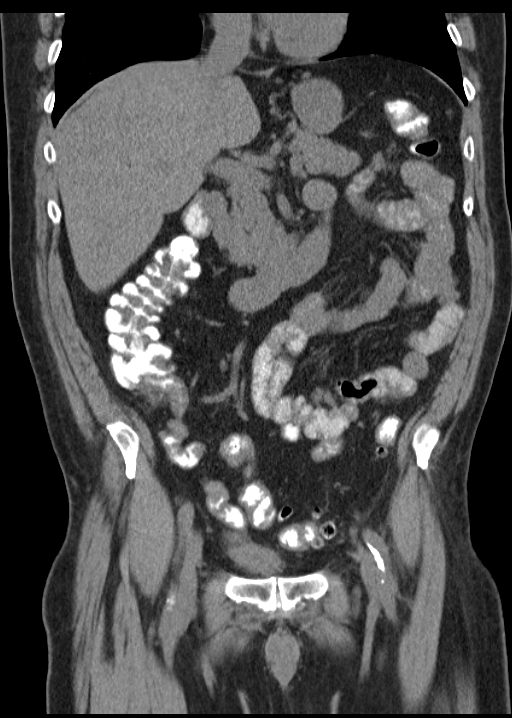
[im 50/90  soft-tissue]
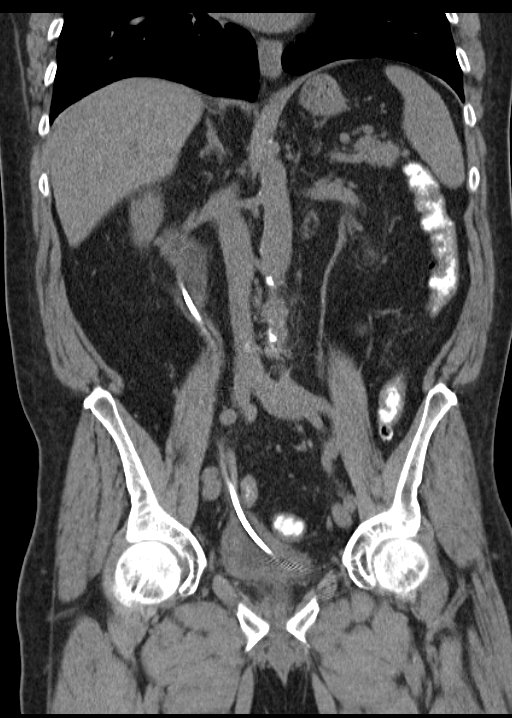

[15 of 46 positions shown; findings below may reference images not displayed]

FINDINGS: Sagittal images of the spine shows disc space flattening with
endplate sclerotic changes and anterior and posterior spurring at
L3-L4 level.  Significant disc space flattening with vacuum disc
phenomenon anterior and posterior spurring noted at L4-L5 level.
Anterior spurring noted lower endplate of the L2 vertebral body.

Lung bases are unremarkable.  The unenhanced liver shows no biliary
ductal dilatation.  No calcified gallstones are noted within
contracted gallbladder.

Unenhanced pancreas and spleen are unremarkable.  Bilateral adrenal
glands are unremarkable.  Retroperitoneal lymph nodes with fatty
hilum again noted without change from prior exam.

There is a moderate right hydronephrosis.  Mild right hydroureter.
There is cortical thinning in the right kidney.  Right ureteral
stent in place is noted. There is thickening of mid right ureteral
wall.  Neoplastic process cannot be excluded.

No nephrolithiasis.  No calcified ureteral calculi are noted.

There is progression of thickening of the urinary bladder wall
probable due to known malignancy.  The urinary bladder is under
distended limiting its assessment.  Prostate gland and seminal
vesicles are unremarkable.  Again noted left inguinal scrotal canal
hernia containing fat without evidence of acute complication.

Small left hydrocele.

Oral contrast material was given to the patient.  No thickened or
dilated small bowel loops.  No thickening of the colonic wall.

No pericecal inflammation.  Normal appendix is clearly visualized
in axial image 62.  Left colon diverticula are noted.  Multiple
sigmoid colon diverticula.  No evidence of acute diverticulitis.
IMPRESSION: 1.  A right ureteral stent is noted.  There is moderate right
hydronephrosis.  Mild right hydroureter.  There is cortical
thinning right kidney probable due to obstructive uropathy. There
is thickening of the mid right ureteral wall.  A neoplastic process
cannot be excluded.

2.  There is significant thickening of urinary bladder wall with
progression from prior exam probable due to known neoplastic
process.  Limited assessment of the urinary bladder which is under
distended.
3.  No small bowel or colonic obstruction.
4.  Distal colonic diverticula without evidence of acute
diverticulitis.
5.  No pericecal inflammation.  Normal appendix.
6.  Stable retroperitoneal lymph nodes.

## 2014-03-13 HISTORY — PX: OTHER SURGICAL HISTORY: SHX169

## 2014-05-10 ENCOUNTER — Encounter: Payer: Self-pay | Admitting: Internal Medicine

## 2014-06-04 ENCOUNTER — Other Ambulatory Visit: Payer: Self-pay

## 2014-06-04 ENCOUNTER — Ambulatory Visit (INDEPENDENT_AMBULATORY_CARE_PROVIDER_SITE_OTHER): Payer: Medicare Other | Admitting: Gastroenterology

## 2014-06-04 ENCOUNTER — Encounter: Payer: Self-pay | Admitting: Gastroenterology

## 2014-06-04 VITALS — BP 101/67 | HR 69 | Temp 97.8°F | Ht 67.0 in | Wt 188.8 lb

## 2014-06-04 DIAGNOSIS — D649 Anemia, unspecified: Secondary | ICD-10-CM

## 2014-06-04 MED ORDER — PEG 3350-KCL-NA BICARB-NACL 420 G PO SOLR: 4000.0000 mL | ORAL | Status: AC

## 2014-06-04 NOTE — Progress Notes (Signed)
Primary Care Physician:  Lars Mage, NP [place]) Primary Gastroenterologist:  Dr. Gala Romney  Urologist: Dr. Brendia Sacks  Chief Complaint  Patient presents with  . Anemia    HPI:   Frank Delacruz presents today at the request of Lars Mage, NP, secondary to anemia. June 2015 Hgb 12.2, down to 8.8 in Sept 2015. TIBC continues to drop with most recent in Oct 208. .  Most recent labs Oct 7 with Hgb improved to 11.2. Heme positive. Believes last colonoscopy at [facility]. Unsure date.   He has a complicated history. Followed at [facility] for history of refractory high-grade urothelial non-invasive bladder and ureteral cancerbilateral upper tract urothelial cancer s/p right nephroureterectomy, with recurrent non-muscle invasive bladder cancer (NMIBC), and subsequent prostate involvement. Underwent robotic-assisted laparoscopic radical cystoprostatectomy with bilateral pelvic lymph node dissection, distal 2/3 left ureterectomy, lysis of adhesions, and ventral hernia repair. Nephrostomy tube in place. Patient at high risk for recurrent high-grade urothelial cancer in remaining left ureteral segment and left renal pelvis. Although options limited, he had been advised to pursue nephroureterectomy, but he would like to avoid dialysis.    No hematochezia. On iron, states stool was black in the summer. Good appetite. No abdominal pain. No N/V.  No dysphagia. Will have rare cases of heartburn but takes Zantac as needed. However, notes remote history of EGD with dilation in the past. Sometimes difficult to have BM. Colace BID. Sometimes BM daily. Sometimes skips 2-3 days.   2 units PRBCs while inpatient at [facility]. No PRBCs while outpatient. Sees oncology at [facility] in November to discuss further treatment. Recent labs completed with Lars Mage, NP, and have been requested.     Past Medical History  Diagnosis Date  . Hyperlipidemia, mild   . Increased prostate specific antigen (PSA)  velocity   . Hypertension   . H/O hiatal hernia   . GERD (gastroesophageal reflux disease) occasionlly takes zantac  . Arthritis of shoulder region, left     lt. shoulder  . History of kidney stones   . Frequency of urination   . Urgency of urination   . Nocturia   . Hematuria   . BPH (benign prostatic hypertrophy)   . Dysuria   . Incomplete emptying of bladder   . History of acute renal failure ADMISSION  04-11-2012  . Diabetes mellitus without complication     new diagnosis 12/13  . Headache(784.0)   . Meningitis spinal 1949  . Urothelial carcinoma OF BLADDER AND RIGHT URETERAL  S/P URETERAL REIMPLANT    Sept 2013  . Hyperkalemia     Past Surgical History  Procedure Laterality Date  . Anterior cervical decomp/discectomy fusion  03-09-2004    C5 - 6/ plates and screws retained  . Echo/ cardiology stress test  04-22-2009  DR [facility]    NORMAL  . Ureterolithotomy  MARCH 2012-- [facility]  . Cysto/  resection bladder lesion with bx  JULY 2012-- [facility]  . Rotator cuff repair  1997    LEFT  . Inguinal hernia repair  2006    RIGHT  . Cystoscopy w/ retrogrades  11/21/2011    Procedure: CYSTOSCOPY WITH RETROGRADE PYELOGRAM;  Surgeon: Franchot Gallo, MD;  Location: [facility];  Service: Urology;  Laterality: Bilateral;  BLADDER BX   . Ureteral reimplantion  01/14/2012    Procedure: URETERAL REIMPLANT;  Surgeon: Franchot Gallo, MD;  Location: [facility];  Service: Urology;  Laterality: Right;  RIGHT SEGMENTAL URETERECTOMY WITH RIGHT URETERAL REIMPLANT   .  Ureterectomy  01/14/2012    Procedure: URETERECTOMY;  Surgeon: Franchot Gallo, MD;  Location: [facility];  Service: Urology;  Laterality: Right;  . Transurethral resection of bladder tumor  09/05/2012    Procedure: TRANSURETHRAL RESECTION OF BLADDER TUMOR (TURBT);  Surgeon: Franchot Gallo, MD;  [facility];  Service: Urology;  Laterality: N/A;  90 MIN   . Transurethral incision of prostate   09/05/2012    Procedure: TRANSURETHRAL INCISION OF THE PROSTATE (TUIP);  Surgeon: Franchot Gallo, MD;  Location: [facility];  Service: Urology;  Laterality: N/A;  . Cystoscopy w/ retrogrades  09/05/2012    Procedure: CYSTOSCOPY WITH RETROGRADE PYELOGRAM;  Surgeon: Franchot Gallo, MD;  Location: [facility];  Service: Urology;  Laterality: Left;  . Nephrolithotomy Right 02/05/2013    Procedure: NEPHROLITHOTOMY PERCUTANEOUS;  Surgeon: Franchot Gallo, MD;  Location: [facility];  Service: Urology;  Laterality: Right;  RIGHT URETEROSCOPY WITH FLEXIBLE SCOPE THROUGH NEPHROSTOMY TRACT   . Cystoscopy Right 02/05/2013    Procedure: CYSTOSCOPY FLEXIBLE;  Surgeon: Franchot Gallo, MD;  Location: [facility];  Service: Urology;  Laterality: Right;  . Ureteroscopy Right 02/05/2013    Procedure: URETEROSCOPY WITH BIOPSIES WITH STENT PLACEMENT;  Surgeon: Franchot Gallo, MD;  Location: [facility];  Service: Urology;  Laterality: Right;  . Nephrostomy tube    . Robotic-assisted laparoscopic radical cystoprostatectomy with bilateral pelvic lymph node dissection, distal 2/3 left ureterectomy, lysis of adhesions, and ventral hernia repair.  Aug 2015    Current Outpatient Prescriptions  Medication Sig Dispense Refill  . acetaminophen (TYLENOL) 325 MG tablet Take 650 mg by mouth every 6 (six) hours as needed. Takes two at the time when needed      . atenolol (TENORMIN) 50 MG tablet Take 50 mg by mouth every morning.       . docusate sodium (COLACE) 100 MG capsule Take 100 mg by mouth 2 (two) times daily.      . ergocalciferol (VITAMIN D2) 50000 UNITS capsule Take 50,000 Units by mouth once a week.      . gabapentin (NEURONTIN) 300 MG capsule Take 600 mg by mouth at bedtime.      Marland Kitchen glipiZIDE (GLUCOTROL XL) 5 MG 24 hr tablet Take 2.5 mg by mouth daily.       . iron polysaccharides (NIFEREX) 150 MG capsule Take 150 mg by mouth 3 (three) times daily.      . Menthol, Topical Analgesic, (ICY  HOT PAIN RELIEVING EX) Apply 1 application topically daily as needed (for pain).      . Ranitidine HCl (ZANTAC PO) Take 75 mg by mouth daily as needed (for heartburn). For acid reflux      . simvastatin (ZOCOR) 20 MG tablet Take 20 mg by mouth every evening.      Marland Kitchen tetrahydrozoline 0.05 % ophthalmic solution Place 1 drop into both eyes daily as needed (for dry eyes).      . vitamin B-12 (CYANOCOBALAMIN) 1000 MCG tablet Take 1,000 mcg by mouth 2 (two) times daily.      . vitamin C (ASCORBIC ACID) 500 MG tablet Take 500 mg by mouth daily.      . polyethylene glycol-electrolytes (TRILYTE) 420 G solution Take 4,000 mLs by mouth as directed.  4000 mL  0   No current facility-administered medications for this visit.    Allergies as of 06/04/2014 - Review Complete 06/04/2014  Allergen Reaction Noted  . Meloxicam Nausea And Vomiting   . Naproxen Nausea And Vomiting  Family History  Problem Relation Age of Onset  . Stroke Mother   . Cancer Father   . Colon cancer Neg Hx     History   Social History  . Marital Status: Married    Spouse Name: N/A    Number of Children: N/A  . Years of Education: N/A   Occupational History  . Not on file.   Social History Main Topics  . Smoking status: Never Smoker   . Smokeless tobacco: Current User    Types: Chew     Comment: Never smoked/ chews a little every day  . Alcohol Use: No  . Drug Use: No  . Sexual Activity: Not on file   Other Topics Concern  . Not on file   Social History Narrative  . No narrative on file    Review of Systems: Gen: +fatigue CV: Denies chest pain, heart palpitations, peripheral edema, syncope.  Resp: Denies shortness of breath at rest or with exertion. Denies wheezing or cough.  GI: see HPI GU : see HPI MS: +back pain intermittently  Derm: Denies rash, itching, dry skin Psych: Denies depression, anxiety, memory loss, and confusion Heme: Denies bruising, bleeding, and enlarged lymph nodes.  Physical  Exam: BP 101/67  Pulse 69  Temp(Src) 97.8 F (36.6 C) (Oral)  Ht 5\' 7"  (1.702 m)  Wt 188 lb 12.8 oz (85.639 kg)  BMI 29.56 kg/m2 General:   Alert and oriented. Pleasant and cooperative. Well-nourished and well-developed.  Head:  Normocephalic and atraumatic. Eyes:  Without icterus, sclera clear and conjunctiva pink.  Ears:  Normal auditory acuity. Nose:  No deformity, discharge,  or lesions. Lungs:  Clear to auscultation bilaterally. No wheezes, rales, or rhonchi. No distress.  Heart:  S1, S2 present without murmurs appreciated.  Abdomen:  +BS, soft, non-tender and non-distended. No HSM noted. No guarding or rebound. No masses appreciated. Midline incision well-healed. Nephrostomy tube to left flank.  Rectal:  Deferred  Msk:  Symmetrical without gross deformities. Normal posture. Extremities:  Without clubbing or edema. Neurologic:  Alert and  oriented x4;  grossly normal neurologically. Skin:  Intact without significant lesions or rashes. Psych:  Alert and cooperative. Normal mood and affect.  June 2015 Hgb 12.2 Sept 2015 Hgb 8.8, iron 63, TIBC 202 Oct 2015 Hgb 11.2, iron 26, TIBC 208  No ferritin on file

## 2014-06-04 NOTE — Assessment & Plan Note (Signed)
68 year old male with history of refractory high-grade urothelial non-invasive bladder and ureteral cancerbilateral upper tract urothelial cancer s/p right nephroureterectomy, with recurrent non-muscle invasive bladder cancer (NMIBC), and subsequent prostate involvement. Underwent robotic-assisted laparoscopic radical cystoprostatectomy with bilateral pelvic lymph node dissection, distal 2/3 left ureterectomy, lysis of adhesions, and ventral hernia repair Aug 2015, now with drifting Hgb and evidence of likely mixed pattern anemia of chronic disease and IDA. Heme positive stool noted. No overt signs of GI bleeding. Last colonoscopy at [facility] in remote past; records requested. Occasional constipation, otherwise no concerning lower or upper GI symptoms. Needs colonoscopy/EGD with Dr. Gala Romney for further evaluation of occult GI process. Consider capsule study if persistent anemia and or worsening IDA.   Proceed with TCS/EGD with Dr. Gala Romney in near future: the risks, benefits, and alternatives have been discussed with the patient in detail. The patient states understanding and desires to proceed. Miralax prn Further recommendations to follow

## 2014-06-04 NOTE — Patient Instructions (Signed)
We have scheduled you for a colonoscopy and upper endoscopy with Dr. Gala Romney. Further recommendations to follow.   For constipation: you may take Miralax as needed daily. Follow a high fiber diet.   High-Fiber Diet Fiber is found in fruits, vegetables, and grains. A high-fiber diet encourages the addition of more whole grains, legumes, fruits, and vegetables in your diet. The recommended amount of fiber for adult males is 38 g per day. For adult females, it is 25 g per day. Pregnant and lactating women should get 28 g of fiber per day. If you have a digestive or bowel problem, ask your caregiver for advice before adding high-fiber foods to your diet. Eat a variety of high-fiber foods instead of only a select few type of foods.  PURPOSE  To increase stool bulk.  To make bowel movements more regular to prevent constipation.  To lower cholesterol.  To prevent overeating. WHEN IS THIS DIET USED?  It may be used if you have constipation and hemorrhoids.  It may be used if you have uncomplicated diverticulosis (intestine condition) and irritable bowel syndrome.  It may be used if you need help with weight management.  It may be used if you want to add it to your diet as a protective measure against atherosclerosis, diabetes, and cancer. SOURCES OF FIBER  Whole-grain breads and cereals.  Fruits, such as apples, oranges, bananas, berries, prunes, and pears.  Vegetables, such as green peas, carrots, sweet potatoes, beets, broccoli, cabbage, spinach, and artichokes.  Legumes, such split peas, soy, lentils.  Almonds. FIBER CONTENT IN FOODS Starches and Grains / Dietary Fiber (g)  Cheerios, 1 cup / 3 g  Corn Flakes cereal, 1 cup / 0.7 g  Rice crispy treat cereal, 1 cup / 0.3 g  Instant oatmeal (cooked),  cup / 2 g  Frosted wheat cereal, 1 cup / 5.1 g  Brown, long-grain rice (cooked), 1 cup / 3.5 g  White, long-grain rice (cooked), 1 cup / 0.6 g  Enriched macaroni (cooked), 1  cup / 2.5 g Legumes / Dietary Fiber (g)  Baked beans (canned, plain, or vegetarian),  cup / 5.2 g  Kidney beans (canned),  cup / 6.8 g  Pinto beans (cooked),  cup / 5.5 g Breads and Crackers / Dietary Fiber (g)  Plain or honey graham crackers, 2 squares / 0.7 g  Saltine crackers, 3 squares / 0.3 g  Plain, salted pretzels, 10 pieces / 1.8 g  Whole-wheat bread, 1 slice / 1.9 g  White bread, 1 slice / 0.7 g  Raisin bread, 1 slice / 1.2 g  Plain bagel, 3 oz / 2 g  Flour tortilla, 1 oz / 0.9 g  Corn tortilla, 1 small / 1.5 g  Hamburger or hotdog bun, 1 small / 0.9 g Fruits / Dietary Fiber (g)  Apple with skin, 1 medium / 4.4 g  Sweetened applesauce,  cup / 1.5 g  Banana,  medium / 1.5 g  Grapes, 10 grapes / 0.4 g  Orange, 1 small / 2.3 g  Raisin, 1.5 oz / 1.6 g  Melon, 1 cup / 1.4 g Vegetables / Dietary Fiber (g)  Green beans (canned),  cup / 1.3 g  Carrots (cooked),  cup / 2.3 g  Broccoli (cooked),  cup / 2.8 g  Peas (cooked),  cup / 4.4 g  Mashed potatoes,  cup / 1.6 g  Lettuce, 1 cup / 0.5 g  Corn (canned),  cup / 1.6 g  Tomato,  cup / 1.1 g Document Released: 07/30/2005 Document Revised: 01/29/2012 Document Reviewed: 11/01/2011 [facility] Patient Information 2015 [place], [place]. This information is not intended to replace advice given to you by your health care provider. Make sure you discuss any questions you have with your health care provider.

## 2014-06-07 ENCOUNTER — Other Ambulatory Visit: Payer: Self-pay

## 2014-06-07 DIAGNOSIS — D649 Anemia, unspecified: Secondary | ICD-10-CM

## 2014-06-08 NOTE — Progress Notes (Signed)
cc'ed to pcp °

## 2014-06-11 ENCOUNTER — Telehealth: Payer: Self-pay

## 2014-06-11 NOTE — Telephone Encounter (Signed)
Pt's wife called and said they need instructions on pt's diabetic meds and iron for his procedure scheduled for 06/24/2014. Please advise!

## 2014-06-14 NOTE — Telephone Encounter (Signed)
No Glucotrol the morning of the procedure. Hold iron X 7 days.

## 2014-06-14 NOTE — Telephone Encounter (Signed)
Called and informed pt.  

## 2014-06-24 ENCOUNTER — Ambulatory Visit (HOSPITAL_COMMUNITY)
Admission: RE | Admit: 2014-06-24 | Discharge: 2014-06-24 | Disposition: A | Discharge status: Home / Self Care | Payer: Medicare Other | Source: Ambulatory Visit | Attending: Internal Medicine | Admitting: Internal Medicine

## 2014-06-24 ENCOUNTER — Encounter (HOSPITAL_COMMUNITY)
Admission: RE | Disposition: A | Discharge status: Home / Self Care | Payer: Self-pay | Source: Ambulatory Visit | Attending: Internal Medicine

## 2014-06-24 ENCOUNTER — Encounter (HOSPITAL_COMMUNITY): Payer: Self-pay

## 2014-06-24 DIAGNOSIS — E785 Hyperlipidemia, unspecified: Secondary | ICD-10-CM | POA: Insufficient documentation

## 2014-06-24 DIAGNOSIS — K2950 Unspecified chronic gastritis without bleeding: Secondary | ICD-10-CM | POA: Insufficient documentation

## 2014-06-24 DIAGNOSIS — K635 Polyp of colon: Secondary | ICD-10-CM

## 2014-06-24 DIAGNOSIS — C679 Malignant neoplasm of bladder, unspecified: Secondary | ICD-10-CM | POA: Diagnosis not present

## 2014-06-24 DIAGNOSIS — Z79899 Other long term (current) drug therapy: Secondary | ICD-10-CM | POA: Diagnosis not present

## 2014-06-24 DIAGNOSIS — Z906 Acquired absence of other parts of urinary tract: Secondary | ICD-10-CM | POA: Diagnosis not present

## 2014-06-24 DIAGNOSIS — D122 Benign neoplasm of ascending colon: Secondary | ICD-10-CM | POA: Diagnosis not present

## 2014-06-24 DIAGNOSIS — I10 Essential (primary) hypertension: Secondary | ICD-10-CM | POA: Diagnosis not present

## 2014-06-24 DIAGNOSIS — D124 Benign neoplasm of descending colon: Secondary | ICD-10-CM | POA: Diagnosis not present

## 2014-06-24 DIAGNOSIS — K921 Melena: Secondary | ICD-10-CM | POA: Diagnosis present

## 2014-06-24 DIAGNOSIS — Z809 Family history of malignant neoplasm, unspecified: Secondary | ICD-10-CM | POA: Insufficient documentation

## 2014-06-24 DIAGNOSIS — D125 Benign neoplasm of sigmoid colon: Secondary | ICD-10-CM | POA: Insufficient documentation

## 2014-06-24 DIAGNOSIS — K222 Esophageal obstruction: Secondary | ICD-10-CM | POA: Insufficient documentation

## 2014-06-24 DIAGNOSIS — K5730 Diverticulosis of large intestine without perforation or abscess without bleeding: Secondary | ICD-10-CM | POA: Insufficient documentation

## 2014-06-24 DIAGNOSIS — C661 Malignant neoplasm of right ureter: Secondary | ICD-10-CM | POA: Insufficient documentation

## 2014-06-24 DIAGNOSIS — Z860100 Personal history of colon polyps, unspecified: Secondary | ICD-10-CM | POA: Insufficient documentation

## 2014-06-24 DIAGNOSIS — E119 Type 2 diabetes mellitus without complications: Secondary | ICD-10-CM | POA: Diagnosis not present

## 2014-06-24 DIAGNOSIS — D649 Anemia, unspecified: Secondary | ICD-10-CM | POA: Insufficient documentation

## 2014-06-24 DIAGNOSIS — K3189 Other diseases of stomach and duodenum: Secondary | ICD-10-CM | POA: Insufficient documentation

## 2014-06-24 HISTORY — PX: ESOPHAGOGASTRODUODENOSCOPY: SHX5428

## 2014-06-24 HISTORY — PX: COLONOSCOPY: SHX5424

## 2014-06-24 SURGERY — COLONOSCOPY
Anesthesia: Moderate Sedation

## 2014-06-24 MED ORDER — LIDOCAINE VISCOUS 2 % MT SOLN
OROMUCOSAL | Status: DC | PRN
  Administered 2014-06-24: 3 mL via OROMUCOSAL

## 2014-06-24 MED ORDER — ONDANSETRON HCL 4 MG/2ML IJ SOLN
INTRAMUSCULAR | Status: DC | PRN
  Administered 2014-06-24: 4 mg via INTRAVENOUS

## 2014-06-24 MED ORDER — MIDAZOLAM HCL 5 MG/5ML IJ SOLN
INTRAMUSCULAR | Status: DC | PRN
  Administered 2014-06-24: 1 mg via INTRAVENOUS
  Administered 2014-06-24: 2 mg via INTRAVENOUS
  Administered 2014-06-24: 1 mg via INTRAVENOUS
  Administered 2014-06-24: 2 mg via INTRAVENOUS
  Administered 2014-06-24 (×3): 1 mg via INTRAVENOUS

## 2014-06-24 MED ORDER — SODIUM CHLORIDE 0.9 % IV SOLN
INTRAVENOUS | Status: DC
  Administered 2014-06-24: 09:00:00 via INTRAVENOUS

## 2014-06-24 MED ORDER — STERILE WATER FOR IRRIGATION IR SOLN
Status: DC | PRN
  Administered 2014-06-24: 10:00:00

## 2014-06-24 MED ORDER — LIDOCAINE VISCOUS 2 % MT SOLN
OROMUCOSAL | Status: AC
  Filled 2014-06-24: qty 15

## 2014-06-24 MED ORDER — MIDAZOLAM HCL 5 MG/5ML IJ SOLN
INTRAMUSCULAR | Status: AC
  Filled 2014-06-24: qty 10

## 2014-06-24 MED ORDER — MEPERIDINE HCL 100 MG/ML IJ SOLN
INTRAMUSCULAR | Status: DC | PRN
  Administered 2014-06-24: 25 mg via INTRAVENOUS
  Administered 2014-06-24: 50 mg via INTRAVENOUS
  Administered 2014-06-24: 25 mg via INTRAVENOUS

## 2014-06-24 MED ORDER — MEPERIDINE HCL 100 MG/ML IJ SOLN
INTRAMUSCULAR | Status: AC
  Filled 2014-06-24: qty 2

## 2014-06-24 MED ORDER — ONDANSETRON HCL 4 MG/2ML IJ SOLN
INTRAMUSCULAR | Status: AC
  Filled 2014-06-24: qty 2

## 2014-06-24 NOTE — Op Note (Signed)
[facility] [address] [place], [address]   ENDOSCOPY PROCEDURE REPORT  PATIENT: Frank Delacruz, Frank Delacruz  MR#: [id number] BIRTHDATE: 1946/01/06 , 58  yrs. old GENDER: male ENDOSCOPIST: R.  Garfield Cornea, MD FACP FACG REFERRED BY:  Redge Gainer, M.D. PROCEDURE DATE:  07/16/2014 PROCEDURE:  EGD w/ biopsy INDICATIONS:  Anemia; Hemoccult-positive stool. MEDICATIONS: Versed 5 mg IV and Demerol 75 mg IV in divided doses. Zofran 4 mg IV.  Xylocaine gel orally. ASA CLASS:      Class III  CONSENT: The risks, benefits, limitations, alternatives and imponderables have been discussed.  The potential for biopsy, esophogeal dilation, etc. have also been reviewed.  Questions have been answered.  All parties agreeable.  Please see the history and physical in the medical record for more information.  DESCRIPTION OF PROCEDURE: After the risks benefits and alternatives of the procedure were thoroughly explained, informed consent was obtained.  The EG-[id number] ([id number]) endoscope was introduced through the mouth and advanced to the second portion of the duodenum , limited by Without limitations. The instrument was slowly withdrawn as the mucosa was fully examined.    Distal esophageal ring with superimposed inflammatory component some overlying vegetable matter adherent.  Capsule coating attached to the GE junction also a possible etiology.  Was moved away with the biopsy forceps but would not wash away.  Underlying mucosa inflamed at the GE junction.  No Barrett's esophagus.  No tumor seen.  The scope easily traversed this area.  Stomach empty.  Patchy erythema of the gastric mucosa; pylorus is patulous and easily traversed with scope ; examination of the bulb and second portion revealed no abnormalities.  Retroflexed views revealed as previously described. Biopsies of the abnormal gastric mucosa taken for histologic study.The scope was then withdrawn from the patient and  the procedure completed.  COMPLICATIONS: There were no immediate complications.  ENDOSCOPIC IMPRESSION: Inflammatory changes distal esophagus with noncritical ring/stricture consistent with reflux esophagitis. Abnormal gastric mucosa certain significance?"status post gastric biopsy  RECOMMENDATIONS: Stop ranitidine; begin Protonix 40 mg daily Follow up on pathology report. See colonoscopy report.  REPEAT EXAM:  eSigned:  R. Garfield Cornea, MD Rosalita Chessman [facility] 07/16/2014 10:21 AM    CC:  CPT CODES: ICD CODES:  The ICD and CPT codes recommended by this software are interpretations from the data that the clinical staff has captured with the software.  The verification of the translation of this report to the ICD and CPT codes and modifiers is the sole responsibility of the health care institution and practicing physician where this report was generated.  [place]. will not be held responsible for the validity of the ICD and CPT codes included on this report.  AMA assumes no liability for data contained or not contained herein. CPT is a Designer, television/film set of the [employer].  PATIENT NAME:  [name], [name] MR#: [id number]

## 2014-06-24 NOTE — H&P (View-Only) (Signed)
Primary Care Physician:  Lars Mage, NP [place]) Primary Gastroenterologist:  Dr. Gala Romney  Urologist: Dr. Brendia Sacks  Chief Complaint  Patient presents with  . Anemia    HPI:   Frank Delacruz presents today at the request of Lars Mage, NP, secondary to anemia. June 2015 Hgb 12.2, down to 8.8 in Sept 2015. TIBC continues to drop with most recent in Oct 208. .  Most recent labs Oct 7 with Hgb improved to 11.2. Heme positive. Believes last colonoscopy at [facility]. Unsure date.   He has a complicated history. 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 Underwent robotic-assisted laparoscopic radical cystoprostatectomy with bilateral pelvic lymph node dissection, distal 2/3 left ureterectomy, lysis of adhesions, and ventral hernia repair. Nephrostomy tube in place. Patient at high risk for recurrent high-grade urothelial cancer in remaining left ureteral segment and left renal pelvis. Although options limited, he had been advised to pursue nephroureterectomy, but he would like to avoid dialysis.    No hematochezia. On iron, states stool was black in the summer. Good appetite. No abdominal pain. No N/V.  No dysphagia. Will have rare cases of heartburn but takes Zantac as needed. However, notes remote history of EGD with dilation in the past. Sometimes difficult to have BM. Colace BID. Sometimes BM daily. Sometimes skips 2-3 days.   2 units PRBCs while inpatient at [facility]. No PRBCs while outpatient. Sees oncology at [facility] in November to discuss further treatment. Recent labs completed with Lars Mage, NP, and have been requested.     Past Medical History  Diagnosis Date  . Hyperlipidemia, mild   . Increased prostate specific antigen (PSA)  velocity   . Hypertension   . H/O hiatal hernia   . GERD (gastroesophageal reflux disease) occasionlly takes zantac  . Arthritis of shoulder region, left     lt. shoulder  . History of kidney stones   . Frequency of urination   . Urgency of urination   . Nocturia   . Hematuria   . BPH (benign prostatic hypertrophy)   . Dysuria   . Incomplete emptying of bladder   . History of acute renal failure ADMISSION  04-11-2012  . Diabetes mellitus without complication     new diagnosis 12/13  . Headache(784.0)   . Meningitis spinal 1949  . Urothelial carcinoma OF BLADDER AND RIGHT URETERAL  S/P URETERAL REIMPLANT    Sept 2013  . Hyperkalemia     Past Surgical History  Procedure Laterality Date  . Anterior cervical decomp/discectomy fusion  03-09-2004    C5 - 6/ plates and screws retained  . Echo/ cardiology stress test  04-22-2009  [facility]    NORMAL  . Ureterolithotomy  MARCH 2012-- [facility]  . Cysto/  resection bladder lesion with bx  JULY 2012-- [facility]  . Rotator cuff repair  1997    LEFT  . Inguinal hernia repair  2006    RIGHT  . Cystoscopy w/ retrogrades  11/21/2011    Procedure: CYSTOSCOPY WITH RETROGRADE PYELOGRAM;  Surgeon: Franchot Gallo, MD;  Location: [facility];  Service: Urology;  Laterality: Bilateral;  BLADDER BX   . Ureteral reimplantion  01/14/2012    Procedure: URETERAL REIMPLANT;  Surgeon: Franchot Gallo, MD;  Location: [facility];  Service: Urology;  Laterality: Right;  RIGHT SEGMENTAL URETERECTOMY WITH RIGHT URETERAL REIMPLANT   .  Ureterectomy  01/14/2012    Procedure: URETERECTOMY;  Surgeon: Franchot Gallo, MD;  Location: [facility];  Service: Urology;  Laterality: Right;  . Transurethral resection of bladder tumor  09/05/2012    Procedure: TRANSURETHRAL RESECTION OF BLADDER TUMOR (TURBT);  Surgeon: Franchot Gallo, MD;  Location: [facility];  Service: Urology;  Laterality: N/A;  90 MIN   . Transurethral incision of prostate   09/05/2012    Procedure: TRANSURETHRAL INCISION OF THE PROSTATE (TUIP);  Surgeon: Franchot Gallo, MD;  Location: [facility];  Service: Urology;  Laterality: N/A;  . Cystoscopy w/ retrogrades  09/05/2012    Procedure: CYSTOSCOPY WITH RETROGRADE PYELOGRAM;  Surgeon: Franchot Gallo, MD;  Location: [facility];  Service: Urology;  Laterality: Left;  . Nephrolithotomy Right 02/05/2013    Procedure: NEPHROLITHOTOMY PERCUTANEOUS;  Surgeon: Franchot Gallo, MD;  Location: [facility];  Service: Urology;  Laterality: Right;  RIGHT URETEROSCOPY WITH FLEXIBLE SCOPE THROUGH NEPHROSTOMY TRACT   . Cystoscopy Right 02/05/2013    Procedure: CYSTOSCOPY FLEXIBLE;  Surgeon: Franchot Gallo, MD;  Location: [facility];  Service: Urology;  Laterality: Right;  . Ureteroscopy Right 02/05/2013    Procedure: URETEROSCOPY WITH BIOPSIES WITH STENT PLACEMENT;  Surgeon: Franchot Gallo, MD;  Location: [facility];  Service: Urology;  Laterality: Right;  . Nephrostomy tube    . Robotic-assisted laparoscopic radical cystoprostatectomy with bilateral pelvic lymph node dissection, distal 2/3 left ureterectomy, lysis of adhesions, and ventral hernia repair.  Aug 2015    Current Outpatient Prescriptions  Medication Sig Dispense Refill  . acetaminophen (TYLENOL) 325 MG tablet Take 650 mg by mouth every 6 (six) hours as needed. Takes two at the time when needed      . atenolol (TENORMIN) 50 MG tablet Take 50 mg by mouth every morning.       . docusate sodium (COLACE) 100 MG capsule Take 100 mg by mouth 2 (two) times daily.      . ergocalciferol (VITAMIN D2) 50000 UNITS capsule Take 50,000 Units by mouth once a week.      . gabapentin (NEURONTIN) 300 MG capsule Take 600 mg by mouth at bedtime.      Marland Kitchen glipiZIDE (GLUCOTROL XL) 5 MG 24 hr tablet Take 2.5 mg by mouth daily.       . iron polysaccharides (NIFEREX) 150 MG capsule Take 150 mg by mouth 3 (three) times daily.      . Menthol, Topical Analgesic, (ICY  HOT PAIN RELIEVING EX) Apply 1 application topically daily as needed (for pain).      . Ranitidine HCl (ZANTAC PO) Take 75 mg by mouth daily as needed (for heartburn). For acid reflux      . simvastatin (ZOCOR) 20 MG tablet Take 20 mg by mouth every evening.      Marland Kitchen tetrahydrozoline 0.05 % ophthalmic solution Place 1 drop into both eyes daily as needed (for dry eyes).      . vitamin B-12 (CYANOCOBALAMIN) 1000 MCG tablet Take 1,000 mcg by mouth 2 (two) times daily.      . vitamin C (ASCORBIC ACID) 500 MG tablet Take 500 mg by mouth daily.      . polyethylene glycol-electrolytes (TRILYTE) 420 G solution Take 4,000 mLs by mouth as directed.  4000 mL  0   No current facility-administered medications for this visit.    Allergies as of 06/04/2014 - Review Complete 06/04/2014  Allergen Reaction Noted  . Meloxicam Nausea And Vomiting   . Naproxen Nausea And Vomiting  Family History  Problem Relation Age of Onset  . Stroke Mother   . Cancer Father   . Colon cancer Neg Hx     History   Social History  . Marital Status: Married    Spouse Name: N/A    Number of Children: N/A  . Years of Education: N/A   Occupational History  . Not on file.   Social History Main Topics  . Smoking status: Never Smoker   . Smokeless tobacco: Current User    Types: Chew     Comment: Never smoked/ chews a little every day  . Alcohol Use: No  . Drug Use: No  . Sexual Activity: Not on file   Other Topics Concern  . Not on file   Social History Narrative  . No narrative on file    Review of Systems: Gen: +fatigue CV: Denies chest pain, heart palpitations, peripheral edema, syncope.  Resp: Denies shortness of breath at rest or with exertion. Denies wheezing or cough.  GI: see HPI GU : see HPI MS: +back pain intermittently  Derm: Denies rash, itching, dry skin Psych: Denies depression, anxiety, memory loss, and confusion Heme: Denies bruising, bleeding, and enlarged lymph nodes.  Physical  Exam: BP 101/67  Pulse 69  Temp(Src) 97.8 F (36.6 C) (Oral)  Ht 5\' 7"  (1.702 m)  Wt 188 lb 12.8 oz (85.639 kg)  BMI 29.56 kg/m2 General:   Alert and oriented. Pleasant and cooperative. Well-nourished and well-developed.  Head:  Normocephalic and atraumatic. Eyes:  Without icterus, sclera clear and conjunctiva pink.  Ears:  Normal auditory acuity. Nose:  No deformity, discharge,  or lesions. Lungs:  Clear to auscultation bilaterally. No wheezes, rales, or rhonchi. No distress.  Heart:  S1, S2 present without murmurs appreciated.  Abdomen:  +BS, soft, non-tender and non-distended. No HSM noted. No guarding or rebound. No masses appreciated. Midline incision well-healed. Nephrostomy tube to left flank.  Rectal:  Deferred  Msk:  Symmetrical without gross deformities. Normal posture. Extremities:  Without clubbing or edema. Neurologic:  Alert and  oriented x4;  grossly normal neurologically. Skin:  Intact without significant lesions or rashes. Psych:  Alert and cooperative. Normal mood and affect.  June 2015 Hgb 12.2 Sept 2015 Hgb 8.8, iron 63, TIBC 202 Oct 2015 Hgb 11.2, iron 26, TIBC 208  No ferritin on file

## 2014-06-24 NOTE — Discharge Instructions (Signed)
Colonoscopy Discharge Instructions  Read the instructions outlined below and refer to this sheet in the next few weeks. These discharge instructions provide you with general information on caring for yourself after you leave the hospital. Your doctor may also give you specific instructions. While your treatment has been planned according to the most current medical practices available, unavoidable complications occasionally occur. If you have any problems or questions after discharge, call Dr. Gala Romney at [phone]. ACTIVITY  You may resume your regular activity, but move at a slower pace for the next 24 hours.   Take frequent rest periods for the next 24 hours.   Walking will help get rid of the air and reduce the bloated feeling in your belly (abdomen).   No driving for 24 hours (because of the medicine (anesthesia) used during the test).    Do not sign any important legal documents or operate any machinery for 24 hours (because of the anesthesia used during the test).  NUTRITION  Drink plenty of fluids.   You may resume your normal diet as instructed by your doctor.   Begin with a light meal and progress to your normal diet. Heavy or fried foods are harder to digest and may make you feel sick to your stomach (nauseated).   Avoid alcoholic beverages for 24 hours or as instructed.  MEDICATIONS  You may resume your normal medications unless your doctor tells you otherwise.  WHAT YOU CAN EXPECT TODAY  Some feelings of bloating in the abdomen.   Passage of more gas than usual.   Spotting of blood in your stool or on the toilet paper.  IF YOU HAD POLYPS REMOVED DURING THE COLONOSCOPY:  No aspirin products for 7 days or as instructed.   No alcohol for 7 days or as instructed.   Eat a soft diet for the next 24 hours.  FINDING OUT THE RESULTS OF YOUR TEST Not all test results are available during your visit. If your test results are not back during the visit, make an appointment  with your caregiver to find out the results. Do not assume everything is normal if you have not heard from your caregiver or the medical facility. It is important for you to follow up on all of your test results.  SEEK IMMEDIATE MEDICAL ATTENTION IF:  You have more than a spotting of blood in your stool.   Your belly is swollen (abdominal distention).   You are nauseated or vomiting.   You have a temperature over 101.  You have abdominal pain or discomfort that is severe or gets worse throughout the day. EGD Discharge instructions Please read the instructions outlined below and refer to this sheet in the next few weeks. These discharge instructions provide you with general information on caring for yourself after you leave the hospital. Your doctor may also give you specific instructions. While your treatment has been planned according to the most current medical practices available, unavoidable complications occasionally occur. If you have any problems or questions after discharge, please call your doctor. ACTIVITY You may resume your regular activity but move at a slower pace for the next 24 hours.  Take frequent rest periods for the next 24 hours.  Walking will help expel (get rid of) the air and reduce the bloated feeling in your abdomen.  No driving for 24 hours (because of the anesthesia (medicine) used during the test).  You may shower.  Do not sign any important legal documents or operate any machinery for 24  hours (because of the anesthesia used during the test).  NUTRITION Drink plenty of fluids.  You may resume your normal diet.  Begin with a light meal and progress to your normal diet.  Avoid alcoholic beverages for 24 hours or as instructed by your caregiver.  MEDICATIONS You may resume your normal medications unless your caregiver tells you otherwise.  WHAT YOU CAN EXPECT TODAY You may experience abdominal discomfort such as a feeling of fullness or gas pains.   FOLLOW-UP Your doctor will discuss the results of your test with you.  SEEK IMMEDIATE MEDICAL ATTENTION IF ANY OF THE FOLLOWING OCCUR: Excessive nausea (feeling sick to your stomach) and/or vomiting.  Severe abdominal pain and distention (swelling).  Trouble swallowing.  Temperature over 101 F (37.8 C).  Rectal bleeding or vomiting of blood.    GERD, polyp and diverticulosis information provided  Stop ranitidine; begin Protonix 40 mg daily  Further recommendations to follow pending review of pathology report  No MRI until clips no longer present. Colon Polyps Polyps are lumps of extra tissue growing inside the body. Polyps can grow in the large intestine (colon). Most colon polyps are noncancerous (benign). However, some colon polyps can become cancerous over time. Polyps that are larger than a pea may be harmful. To be safe, caregivers remove and test all polyps. CAUSES  Polyps form when mutations in the genes cause your cells to grow and divide even though no more tissue is needed. RISK FACTORS There are a number of risk factors that can increase your chances of getting colon polyps. They include: Being older than 50 years. Family history of colon polyps or colon cancer. Long-term colon diseases, such as colitis or Crohn disease. Being overweight. Smoking. Being inactive. Drinking too much alcohol. SYMPTOMS  Most small polyps do not cause symptoms. If symptoms are present, they may include: Blood in the stool. The stool may look dark red or black. Constipation or diarrhea that lasts longer than 1 week. DIAGNOSIS People often do not know they have polyps until their caregiver finds them during a regular checkup. Your caregiver can use 4 tests to check for polyps: Digital rectal exam. The caregiver wears gloves and feels inside the rectum. This test would find polyps only in the rectum. Barium enema. The caregiver puts a liquid called barium into your rectum before taking  X-rays of your colon. Barium makes your colon look white. Polyps are dark, so they are easy to see in the X-ray pictures. Sigmoidoscopy. A thin, flexible tube (sigmoidoscope) is placed into your rectum. The sigmoidoscope has a light and tiny camera in it. The caregiver uses the sigmoidoscope to look at the last third of your colon. Colonoscopy. This test is like sigmoidoscopy, but the caregiver looks at the entire colon. This is the most common method for finding and removing polyps. TREATMENT  Any polyps will be removed during a sigmoidoscopy or colonoscopy. The polyps are then tested for cancer. PREVENTION  To help lower your risk of getting more colon polyps: Eat plenty of fruits and vegetables. Avoid eating fatty foods. Do not smoke. Avoid drinking alcohol. Exercise every day. Lose weight if recommended by your caregiver. Eat plenty of calcium and folate. Foods that are rich in calcium include milk, cheese, and broccoli. Foods that are rich in folate include chickpeas, kidney beans, and spinach. HOME CARE INSTRUCTIONS Keep all follow-up appointments as directed by your caregiver. You may need periodic exams to check for polyps. SEEK MEDICAL CARE IF: You notice bleeding  during a bowel movement. Document Released: 04/25/2004 Document Revised: 10/22/2011 Document Reviewed: 10/09/2011 [facility] Patient Information 2015 [place], [place]. This information is not intended to replace advice given to you by your health care provider. Make sure you discuss any questions you have with your health care provider.

## 2014-06-24 NOTE — Interval H&P Note (Signed)
History and Physical Interval Note:  06/24/2014 9:47 AM  Frank Delacruz  has presented today for surgery, with the diagnosis of anemia  The various methods of treatment have been discussed with the patient and family. After consideration of risks, benefits and other options for treatment, the patient has consented to  Procedure(s) with comments: COLONOSCOPY (N/A) - 945 - moved to 10:00 - Ginger to notify pt ESOPHAGOGASTRODUODENOSCOPY (EGD) (N/A) as a surgical intervention .  The patient's history has been reviewed, patient examined, no change in status, stable for surgery.  I have reviewed the patient's chart and labs.  Questions were answered to the patient's satisfaction.     Robert Rourk  No change. EGD colonoscopy for plan.The risks, benefits, limitations, imponderables and alternatives regarding both EGD and colonoscopy have been reviewed with the patient. Questions have been answered. All parties agreeable.

## 2014-06-24 NOTE — Op Note (Signed)
[facility] [address] [place], [address]   COLONOSCOPY PROCEDURE REPORT  PATIENT: Frank, Delacruz  MR#: [id number] BIRTHDATE: Feb 21, 1946 , 31  yrs. old GENDER: male ENDOSCOPIST: R.  Garfield Cornea, MD FACP [facility] REFERRED XQ:JJHERD Laurance Flatten, M.D. PROCEDURE DATE:  July 16, 2014 PROCEDURE:   Colonoscopy with biopsy, Colonoscopy with ablation, and Colonoscopy with snare polypectomy INDICATIONS:Hemoccult-positive stool. MEDICATIONS: Versed 9 mg IV and Demerol 100 mg IV in divided doses. Zofran 4 mg IV. ASA CLASS:       Class III  CONSENT: The risks, benefits, alternatives and imponderables including but not limited to bleeding, perforation as well as the possibility of a missed lesion have been reviewed.  The potential for biopsy, lesion removal, etc. have also been discussed. Questions have been answered.  All parties agreeable.  Please see the history and physical in the medical record for more information.  DESCRIPTION OF PROCEDURE:   After the risks benefits and alternatives of the procedure were thoroughly explained, informed consent was obtained.  The digital rectal exam revealed no abnormalities of the rectum.   The EC-[id number] ([id number])  endoscope was introduced through the anus and advanced to the terminal ileum which was intubated for a short distance. No adverse events experienced.   The quality of the prep was adequate.  The instrument was then slowly withdrawn as the colon was fully examined.      COLON FINDINGS: Normal rectum.  Extensive left-sided diverticula; (1) 5 mm polyp in the proximal ascending segment; (1) 1 cm polyp on a stalk in the mid descending segment with an adjacent diminutive polyp.  Finally, a single diminutive polyp found in the midsigmoid segment otherwise, the remainder of the colonic mucosa appeared normal.  The distal 10 cm of terminal ileum mucosa also appeared normal.  The above-mentioned polyps were cold and hot snare  removed, respectively.  The diminutive descending colon polyp was ablated with the tip of the hot snare loop.  Finally, the sigmoid polyp was cold biopsied/removed.  Retroflexed views revealed no abnormalities. .  The largest polypectomy site in the descending segment wanted to ooze.  I closed the polypectomy site with 2 inistinct clips.  Withdrawal time=22 minutes 0 seconds.  The scope was withdrawn and the procedure completed. COMPLICATIONS: There were no immediate complications.  ENDOSCOPIC IMPRESSION: Multiple colonic polyps treated/removed as described above. Colonic diverticulosis.  RECOMMENDATIONS: Follow-up on pathology. See EGD report.   No MRI until clips gone  eSigned:  R. Garfield Cornea, MD Rosalita Chessman [facility] 07/16/14 11:22 AM   cc:  CPT CODES: ICD CODES:  The ICD and CPT codes recommended by this software are interpretations from the data that the clinical staff has captured with the software.  The verification of the translation of this report to the ICD and CPT codes and modifiers is the sole responsibility of the health care institution and practicing physician where this report was generated.  [employer]. will not be held responsible for the validity of the ICD and CPT codes included on this report.  AMA assumes no liability for data contained or not contained herein. CPT is a Designer, television/film set of the [employer].  PATIENT NAME:  Frank, Delacruz MR#: [id number]

## 2014-06-25 LAB — GLUCOSE, CAPILLARY: Glucose-Capillary: 164 mg/dL — ABNORMAL HIGH (ref 70–99)

## 2014-06-28 ENCOUNTER — Encounter (HOSPITAL_COMMUNITY): Payer: Self-pay | Admitting: Internal Medicine

## 2014-07-10 ENCOUNTER — Encounter: Payer: Self-pay | Admitting: Internal Medicine

## 2014-11-15 NOTE — Patient Instructions (Signed)
Your procedure is scheduled on: 11/22/2014  Report to [facility] at  1100   AM.  Call this number if you have problems the morning of surgery: [phone]   Do not eat food or drink liquids :After Midnight.      Take these medicines the morning of surgery with A SIP OF WATER: atenolol, neurontin   Do not wear jewelry, make-up or nail polish.  Do not wear lotions, powders, or perfumes.   Do not shave 48 hours prior to surgery.  Do not bring valuables to the hospital.  Contacts, dentures or bridgework may not be worn into surgery.  Leave suitcase in the car. After surgery it may be brought to your room.  For patients admitted to the hospital, checkout time is 11:00 AM the day of discharge.   Patients discharged the day of surgery will not be allowed to drive home.  :     Please read over the following fact sheets that you were given: Coughing and Deep Breathing, Surgical Site Infection Prevention, Anesthesia Post-op Instructions and Care and Recovery After Surgery    Cataract A cataract is a clouding of the lens of the eye. When a lens becomes cloudy, vision is reduced based on the degree and nature of the clouding. Many cataracts reduce vision to some degree. Some cataracts make people more near-sighted as they develop. Other cataracts increase glare. Cataracts that are ignored and become worse can sometimes look white. The white color can be seen through the pupil. CAUSES   Aging. However, cataracts may occur at any age, even in newborns.   Certain drugs.   Trauma to the eye.   Certain diseases such as diabetes.   Specific eye diseases such as chronic inflammation inside the eye or a sudden attack of a rare form of glaucoma.   Inherited or acquired medical problems.  SYMPTOMS   Gradual, progressive drop in vision in the affected eye.   Severe, rapid visual loss. This most often happens when trauma is the cause.  DIAGNOSIS  To detect a cataract, an eye doctor examines the  lens. Cataracts are best diagnosed with an exam of the eyes with the pupils enlarged (dilated) by drops.  TREATMENT  For an early cataract, vision may improve by using different eyeglasses or stronger lighting. If that does not help your vision, surgery is the only effective treatment. A cataract needs to be surgically removed when vision loss interferes with your everyday activities, such as driving, reading, or watching TV. A cataract may also have to be removed if it prevents examination or treatment of another eye problem. Surgery removes the cloudy lens and usually replaces it with a substitute lens (intraocular lens, IOL).  At a time when both you and your doctor agree, the cataract will be surgically removed. If you have cataracts in both eyes, only one is usually removed at a time. This allows the operated eye to heal and be out of danger from any possible problems after surgery (such as infection or poor wound healing). In rare cases, a cataract may be doing damage to your eye. In these cases, your caregiver may advise surgical removal right away. The vast majority of people who have cataract surgery have better vision afterward. HOME CARE INSTRUCTIONS  If you are not planning surgery, you may be asked to do the following:  Use different eyeglasses.   Use stronger or brighter lighting.   Ask your eye doctor about reducing your medicine dose or changing  medicines if it is thought that a medicine caused your cataract. Changing medicines does not make the cataract go away on its own.   Become familiar with your surroundings. Poor vision can lead to injury. Avoid bumping into things on the affected side. You are at a higher risk for tripping or falling.   Exercise extreme care when driving or operating machinery.   Wear sunglasses if you are sensitive to bright light or experiencing problems with glare.  SEEK IMMEDIATE MEDICAL CARE IF:   You have a worsening or sudden vision loss.   You  notice redness, swelling, or increasing pain in the eye.   You have a fever.  Document Released: 07/30/2005 Document Revised: 07/19/2011 Document Reviewed: 03/23/2011 [facility] Patient Information 2012 [email or web address] INSTRUCTIONS POST-ANESTHESIA  IMMEDIATELY FOLLOWING SURGERY:  Do not drive or operate machinery for the first twenty four hours after surgery.  Do not make any important decisions for twenty four hours after surgery or while taking narcotic pain medications or sedatives.  If you develop intractable nausea and vomiting or a severe headache please notify your doctor immediately.  FOLLOW-UP:  Please make an appointment with your surgeon as instructed. You do not need to follow up with anesthesia unless specifically instructed to do so.  WOUND CARE INSTRUCTIONS (if applicable):  Keep a dry clean dressing on the anesthesia/puncture wound site if there is drainage.  Once the wound has quit draining you may leave it open to air.  Generally you should leave the bandage intact for twenty four hours unless there is drainage.  If the epidural site drains for more than 36-48 hours please call the anesthesia department.  QUESTIONS?:  Please feel free to call your physician or the hospital operator if you have any questions, and they will be happy to assist you.

## 2014-11-16 ENCOUNTER — Encounter (HOSPITAL_COMMUNITY)
Admission: RE | Admit: 2014-11-16 | Discharge: 2014-11-16 | Disposition: A | Discharge status: Home / Self Care | Payer: Medicare Other | Source: Ambulatory Visit | Attending: Ophthalmology | Admitting: Ophthalmology

## 2014-11-16 ENCOUNTER — Other Ambulatory Visit: Payer: Self-pay

## 2014-11-16 ENCOUNTER — Encounter (HOSPITAL_COMMUNITY): Payer: Self-pay

## 2014-11-16 DIAGNOSIS — Z01810 Encounter for preprocedural cardiovascular examination: Secondary | ICD-10-CM | POA: Diagnosis not present

## 2014-11-16 DIAGNOSIS — Z01812 Encounter for preprocedural laboratory examination: Secondary | ICD-10-CM | POA: Diagnosis not present

## 2014-11-16 LAB — CBC
HCT: 32.1 % — ABNORMAL LOW (ref 39.0–52.0)
Hemoglobin: 9.9 g/dL — ABNORMAL LOW (ref 13.0–17.0)
MCH: 25.8 pg — AB (ref 26.0–34.0)
MCHC: 30.8 g/dL (ref 30.0–36.0)
MCV: 83.8 fL (ref 78.0–100.0)
PLATELETS: 236 10*3/uL (ref 150–400)
RBC: 3.83 MIL/uL — ABNORMAL LOW (ref 4.22–5.81)
RDW: 16.7 % — AB (ref 11.5–15.5)
WBC: 10.6 10*3/uL — ABNORMAL HIGH (ref 4.0–10.5)

## 2014-11-16 LAB — BASIC METABOLIC PANEL
Anion gap: 9 (ref 5–15)
BUN: 43 mg/dL — ABNORMAL HIGH (ref 6–23)
CHLORIDE: 107 mmol/L (ref 96–112)
CO2: 19 mmol/L (ref 19–32)
Calcium: 8.2 mg/dL — ABNORMAL LOW (ref 8.4–10.5)
Creatinine, Ser: 3.29 mg/dL — ABNORMAL HIGH (ref 0.50–1.35)
GFR calc Af Amer: 20 mL/min — ABNORMAL LOW (ref >90)
GFR, EST NON AFRICAN AMERICAN: 18 mL/min — AB (ref >90)
Glucose, Bld: 389 mg/dL — ABNORMAL HIGH (ref 70–99)
POTASSIUM: 4.5 mmol/L (ref 3.5–5.1)
Sodium: 135 mmol/L (ref 135–145)

## 2014-11-16 LAB — SURGICAL PCR SCREEN
MRSA, PCR: POSITIVE — AB
Staphylococcus aureus: POSITIVE — AB

## 2014-11-17 MED ORDER — MUPIROCIN 2 % EX OINT: 1.0000 "application " | TOPICAL_OINTMENT | Freq: Once | CUTANEOUS | Status: AC

## 2014-11-19 MED ORDER — CYCLOPENTOLATE-PHENYLEPHRINE OP SOLN OPTIME - NO CHARGE
OPHTHALMIC | Status: AC
  Filled 2014-11-19: qty 2

## 2014-11-19 MED ORDER — TETRACAINE HCL 0.5 % OP SOLN
OPHTHALMIC | Status: AC
  Filled 2014-11-19: qty 2

## 2014-11-19 MED ORDER — PHENYLEPHRINE HCL 2.5 % OP SOLN
OPHTHALMIC | Status: AC
  Filled 2014-11-19: qty 15

## 2014-11-19 MED ORDER — NEOMYCIN-POLYMYXIN-DEXAMETH 3.5-10000-0.1 OP SUSP
OPHTHALMIC | Status: AC
  Filled 2014-11-19: qty 5

## 2014-11-19 MED ORDER — LIDOCAINE HCL (PF) 1 % IJ SOLN
INTRAMUSCULAR | Status: AC
  Filled 2014-11-19: qty 2

## 2014-11-19 MED ORDER — LIDOCAINE HCL 3.5 % OP GEL
OPHTHALMIC | Status: AC
  Filled 2014-11-19: qty 1

## 2014-11-22 ENCOUNTER — Ambulatory Visit (HOSPITAL_COMMUNITY): Payer: Medicare Other | Admitting: Anesthesiology

## 2014-11-22 ENCOUNTER — Encounter (HOSPITAL_COMMUNITY)
Admission: RE | Disposition: A | Discharge status: Home / Self Care | Payer: Self-pay | Source: Ambulatory Visit | Attending: Ophthalmology

## 2014-11-22 ENCOUNTER — Encounter (HOSPITAL_COMMUNITY): Payer: Self-pay | Admitting: *Deleted

## 2014-11-22 ENCOUNTER — Ambulatory Visit (HOSPITAL_COMMUNITY)
Admission: RE | Admit: 2014-11-22 | Discharge: 2014-11-22 | Disposition: A | Discharge status: Home / Self Care | Payer: Medicare Other | Source: Ambulatory Visit | Attending: Ophthalmology | Admitting: Ophthalmology

## 2014-11-22 DIAGNOSIS — H2512 Age-related nuclear cataract, left eye: Secondary | ICD-10-CM | POA: Insufficient documentation

## 2014-11-22 DIAGNOSIS — H269 Unspecified cataract: Secondary | ICD-10-CM | POA: Diagnosis present

## 2014-11-22 DIAGNOSIS — Z79899 Other long term (current) drug therapy: Secondary | ICD-10-CM | POA: Insufficient documentation

## 2014-11-22 DIAGNOSIS — K219 Gastro-esophageal reflux disease without esophagitis: Secondary | ICD-10-CM | POA: Diagnosis not present

## 2014-11-22 DIAGNOSIS — I10 Essential (primary) hypertension: Secondary | ICD-10-CM | POA: Diagnosis not present

## 2014-11-22 DIAGNOSIS — E119 Type 2 diabetes mellitus without complications: Secondary | ICD-10-CM | POA: Insufficient documentation

## 2014-11-22 HISTORY — PX: CATARACT EXTRACTION W/PHACO: SHX586

## 2014-11-22 LAB — GLUCOSE, CAPILLARY: Glucose-Capillary: 245 mg/dL — ABNORMAL HIGH (ref 70–99)

## 2014-11-22 SURGERY — PHACOEMULSIFICATION, CATARACT, WITH IOL INSERTION
Anesthesia: Monitor Anesthesia Care | Site: Eye | Laterality: Left

## 2014-11-22 MED ORDER — LACTATED RINGERS IV SOLN
INTRAVENOUS | Status: DC
  Administered 2014-11-22: 1000 mL via INTRAVENOUS

## 2014-11-22 MED ORDER — TETRACAINE HCL 0.5 % OP SOLN
1.0000 [drp] | OPHTHALMIC | Status: AC
  Administered 2014-11-22 (×3): 1 [drp] via OPHTHALMIC

## 2014-11-22 MED ORDER — FENTANYL CITRATE 0.05 MG/ML IJ SOLN
25.0000 ug | INTRAMUSCULAR | Status: AC
  Administered 2014-11-22: 25 ug via INTRAVENOUS

## 2014-11-22 MED ORDER — LIDOCAINE HCL 3.5 % OP GEL
1.0000 "application " | Freq: Once | OPHTHALMIC | Status: AC
  Administered 2014-11-22: 1 via OPHTHALMIC

## 2014-11-22 MED ORDER — LIDOCAINE HCL (PF) 1 % IJ SOLN
INTRAMUSCULAR | Status: DC | PRN
  Administered 2014-11-22: .5 mL

## 2014-11-22 MED ORDER — POVIDONE-IODINE 5 % OP SOLN
OPHTHALMIC | Status: DC | PRN
  Administered 2014-11-22: 1 via OPHTHALMIC

## 2014-11-22 MED ORDER — FENTANYL CITRATE 0.05 MG/ML IJ SOLN
INTRAMUSCULAR | Status: AC
  Filled 2014-11-22: qty 2

## 2014-11-22 MED ORDER — EPINEPHRINE HCL 1 MG/ML IJ SOLN
INTRAOCULAR | Status: DC | PRN
  Administered 2014-11-22: 12:00:00

## 2014-11-22 MED ORDER — MIDAZOLAM HCL 2 MG/2ML IJ SOLN
1.0000 mg | INTRAMUSCULAR | Status: DC | PRN
  Administered 2014-11-22: 2 mg via INTRAVENOUS

## 2014-11-22 MED ORDER — BSS IO SOLN
INTRAOCULAR | Status: DC | PRN
  Administered 2014-11-22: 15 mL via INTRAOCULAR

## 2014-11-22 MED ORDER — PROVISC 10 MG/ML IO SOLN
INTRAOCULAR | Status: DC | PRN
  Administered 2014-11-22: 0.85 mL via INTRAOCULAR

## 2014-11-22 MED ORDER — MIDAZOLAM HCL 2 MG/2ML IJ SOLN
INTRAMUSCULAR | Status: AC
  Filled 2014-11-22: qty 2

## 2014-11-22 MED ORDER — NEOMYCIN-POLYMYXIN-DEXAMETH 3.5-10000-0.1 OP SUSP
OPHTHALMIC | Status: DC | PRN
  Administered 2014-11-22: 2 [drp] via OPHTHALMIC

## 2014-11-22 MED ORDER — CYCLOPENTOLATE-PHENYLEPHRINE 0.2-1 % OP SOLN
1.0000 [drp] | OPHTHALMIC | Status: AC
  Administered 2014-11-22 (×3): 1 [drp] via OPHTHALMIC

## 2014-11-22 MED ORDER — PHENYLEPHRINE HCL 2.5 % OP SOLN
1.0000 [drp] | OPHTHALMIC | Status: AC
  Administered 2014-11-22 (×3): 1 [drp] via OPHTHALMIC

## 2014-11-22 SURGICAL SUPPLY — 11 items
CLOTH BEACON ORANGE TIMEOUT ST (SAFETY) ×2 IMPLANT
EYE SHIELD UNIVERSAL CLEAR (GAUZE/BANDAGES/DRESSINGS) ×2 IMPLANT
GLOVE BIOGEL PI IND STRL 6.5 (GLOVE) IMPLANT
GLOVE BIOGEL PI INDICATOR 6.5 (GLOVE) ×2
GLOVE EXAM NITRILE MD LF STRL (GLOVE) ×2 IMPLANT
PAD ARMBOARD 7.5X6 YLW CONV (MISCELLANEOUS) ×2 IMPLANT
SIGHTPATH CAT PROC W REG LENS (Ophthalmic Related) ×3 IMPLANT
SYRINGE LUER LOK 1CC (MISCELLANEOUS) ×2 IMPLANT
TAPE SURG TRANSPARENT 2IN (GAUZE/BANDAGES/DRESSINGS) IMPLANT
TAPE TRANSPARENT 2IN (GAUZE/BANDAGES/DRESSINGS) ×2
WATER STERILE IRR 250ML POUR (IV SOLUTION) ×2 IMPLANT

## 2014-11-22 NOTE — Transfer of Care (Signed)
Immediate Anesthesia Transfer of Care Note  Patient: Frank Delacruz  Procedure(s) Performed: Procedure(s) with comments: CATARACT EXTRACTION PHACO AND INTRAOCULAR LENS PLACEMENT (IOC) (Left) - CDE:12.38  Patient Location: Short Stay  Anesthesia Type:MAC  Level of Consciousness: awake, alert , oriented and patient cooperative  Airway & Oxygen Therapy: Patient Spontanous Breathing  Post-op Assessment: Report given to RN and Post -op Vital signs reviewed and stable  Post vital signs: Reviewed and stable  Last Vitals:  Filed Vitals:   11/22/14 1150  BP: 460/47  Resp:     Complications: No apparent anesthesia complications

## 2014-11-22 NOTE — Op Note (Signed)
Date of Admission: 11/22/2014  Date of Surgery: 11/22/2014   Pre-Op Dx: Cataract Left Eye  Post-Op Dx: Senile Nuclear Cataract Left  Eye,  Dx Code H25.12  Surgeon: Tonny Branch, M.D.  Assistants: None  Anesthesia: Topical with MAC  Indications: Painless, progressive loss of vision with compromise of daily activities.  Surgery: Cataract Extraction with Intraocular lens Implant Left Eye  Discription: The patient had dilating drops and viscous lidocaine placed into the Left eye in the pre-op holding area. After transfer to the operating room, a time out was performed. The patient was then prepped and draped. Beginning with a 9 degree blade a paracentesis port was made at the surgeon's 2 o'clock position. The anterior chamber was then filled with 1% non-preserved lidocaine. This was followed by filling the anterior chamber with Provisc.  A 2.74mm keratome blade was used to make a clear corneal incision at the temporal limbus.  A bent cystatome needle was used to create a continuous tear capsulotomy. Hydrodissection was performed with balanced salt solution on a Fine canula. The lens nucleus was then removed using the phacoemulsification handpiece. Residual cortex was removed with the I&A handpiece. The anterior chamber and capsular bag were refilled with Provisc. A posterior chamber intraocular lens was placed into the capsular bag with it's injector. The implant was positioned with the Kuglan hook. The Provisc was then removed from the anterior chamber and capsular bag with the I&A handpiece. Stromal hydration of the main incision and paracentesis port was performed with BSS on a Fine canula. The wounds were tested for leak which was negative. The patient tolerated the procedure well. There were no operative complications. The patient was then transferred to the recovery room in stable condition.  Complications: None  Specimen: None  EBL: None  Prosthetic device: Hoya iSert 250, power 20.5 D, SN  [id number].

## 2014-11-22 NOTE — H&P (Signed)
I have reviewed the H&P, the patient was re-examined, and I have identified no interval changes in medical condition and plan of care since the history and physical of record  

## 2014-11-22 NOTE — Anesthesia Postprocedure Evaluation (Signed)
  Anesthesia Post-op Note  Patient: Frank Delacruz  Procedure(s) Performed: Procedure(s) with comments: CATARACT EXTRACTION PHACO AND INTRAOCULAR LENS PLACEMENT (IOC) (Left) - CDE:12.38  Patient Location: Short Stay  Anesthesia Type:MAC  Level of Consciousness: awake, alert , oriented and patient cooperative  Airway and Oxygen Therapy: Patient Spontanous Breathing  Post-op Pain: none  Post-op Assessment: Post-op Vital signs reviewed, Patient's Cardiovascular Status Stable, Respiratory Function Stable, Patent Airway, No signs of Nausea or vomiting and Pain level controlled  Post-op Vital Signs: Reviewed and stable  Last Vitals:  Filed Vitals:   11/22/14 1150  BP: 330/07  Resp:     Complications: No apparent anesthesia complications

## 2014-11-22 NOTE — Anesthesia Procedure Notes (Signed)
Procedure Name: MAC Date/Time: 11/22/2014 11:53 AM Performed by: Andree Elk, Trent Gabler A Pre-anesthesia Checklist: Patient identified, Timeout performed, Emergency Drugs available, Suction available and Patient being monitored Oxygen Delivery Method: Nasal cannula

## 2014-11-22 NOTE — Discharge Instructions (Signed)

## 2014-11-22 NOTE — Anesthesia Preprocedure Evaluation (Signed)
Anesthesia Evaluation  Patient identified by MRN, date of birth, ID band Patient awake    Reviewed: Allergy & Precautions, H&P , NPO status , Patient's Chart, lab work & pertinent test results, reviewed documented beta blocker date and time   History of Anesthesia Complications Negative for: history of anesthetic complications  Airway Mallampati: III  TM Distance: >3 FB Neck ROM: Full    Dental  (+) Edentulous Upper   Pulmonary neg pulmonary ROS,  breath sounds clear to auscultation        Cardiovascular hypertension, Pt. on medications and Pt. on home beta blockers negative cardio ROS  Rhythm:Regular     Neuro/Psych negative neurological ROS  negative psych ROS   GI/Hepatic negative GI ROS, Neg liver ROS, hiatal hernia, GERD-  Medicated,  Endo/Other  negative endocrine ROSdiabetes, Type 2, Oral Hypoglycemic AgentsMorbid obesity  Renal/GU Renal diseasenegative Renal ROS  negative genitourinary   Musculoskeletal negative musculoskeletal ROS (+)   Abdominal   Peds  Hematology negative hematology ROS (+)   Anesthesia Other Findings   Reproductive/Obstetrics negative OB ROS                             Anesthesia Physical Anesthesia Plan  ASA: III  Anesthesia Plan: MAC   Post-op Pain Management:    Induction: Intravenous  Airway Management Planned: Nasal Cannula  Additional Equipment:   Intra-op Plan:   Post-operative Plan:   Informed Consent: I have reviewed the patients History and Physical, chart, labs and discussed the procedure including the risks, benefits and alternatives for the proposed anesthesia with the patient or authorized representative who has indicated his/her understanding and acceptance.     Plan Discussed with:   Anesthesia Plan Comments:         Anesthesia Quick Evaluation

## 2014-11-23 ENCOUNTER — Encounter (HOSPITAL_COMMUNITY): Payer: Self-pay | Admitting: Ophthalmology

## 2014-12-09 ENCOUNTER — Other Ambulatory Visit: Payer: Self-pay | Admitting: Internal Medicine

## 2014-12-17 ENCOUNTER — Encounter (HOSPITAL_COMMUNITY)
Admission: RE | Admit: 2014-12-17 | Discharge: 2014-12-17 | Disposition: A | Discharge status: Home / Self Care | Payer: Medicare Other | Source: Ambulatory Visit | Attending: Ophthalmology | Admitting: Ophthalmology

## 2014-12-17 MED ORDER — PHENYLEPHRINE HCL 2.5 % OP SOLN
OPHTHALMIC | Status: AC
  Filled 2014-12-17: qty 15

## 2014-12-17 MED ORDER — NEOMYCIN-POLYMYXIN-DEXAMETH 3.5-10000-0.1 OP SUSP
OPHTHALMIC | Status: AC
  Filled 2014-12-17: qty 5

## 2014-12-17 MED ORDER — TETRACAINE HCL 0.5 % OP SOLN
OPHTHALMIC | Status: AC
  Filled 2014-12-17: qty 2

## 2014-12-17 MED ORDER — CYCLOPENTOLATE-PHENYLEPHRINE OP SOLN OPTIME - NO CHARGE
OPHTHALMIC | Status: AC
  Filled 2014-12-17: qty 2

## 2014-12-17 MED ORDER — LIDOCAINE HCL 3.5 % OP GEL
OPHTHALMIC | Status: AC
  Filled 2014-12-17: qty 1

## 2014-12-17 MED ORDER — LIDOCAINE HCL (PF) 1 % IJ SOLN
INTRAMUSCULAR | Status: AC
  Filled 2014-12-17: qty 2

## 2014-12-20 ENCOUNTER — Ambulatory Visit (HOSPITAL_COMMUNITY): Payer: Medicare Other | Admitting: Anesthesiology

## 2014-12-20 ENCOUNTER — Ambulatory Visit (HOSPITAL_COMMUNITY)
Admission: RE | Admit: 2014-12-20 | Discharge: 2014-12-20 | Disposition: A | Discharge status: Home / Self Care | Payer: Medicare Other | Source: Ambulatory Visit | Attending: Ophthalmology | Admitting: Ophthalmology

## 2014-12-20 ENCOUNTER — Encounter (HOSPITAL_COMMUNITY)
Admission: RE | Disposition: A | Discharge status: Home / Self Care | Payer: Self-pay | Source: Ambulatory Visit | Attending: Ophthalmology

## 2014-12-20 ENCOUNTER — Encounter (HOSPITAL_COMMUNITY): Payer: Self-pay | Admitting: Ophthalmology

## 2014-12-20 DIAGNOSIS — H25811 Combined forms of age-related cataract, right eye: Secondary | ICD-10-CM | POA: Diagnosis not present

## 2014-12-20 HISTORY — PX: CATARACT EXTRACTION W/PHACO: SHX586

## 2014-12-20 LAB — GLUCOSE, CAPILLARY: Glucose-Capillary: 246 mg/dL — ABNORMAL HIGH (ref 70–99)

## 2014-12-20 SURGERY — PHACOEMULSIFICATION, CATARACT, WITH IOL INSERTION
Anesthesia: Monitor Anesthesia Care | Site: Eye | Laterality: Right

## 2014-12-20 MED ORDER — PROVISC 10 MG/ML IO SOLN
INTRAOCULAR | Status: DC | PRN
  Administered 2014-12-20: 0.85 mL via INTRAOCULAR

## 2014-12-20 MED ORDER — PHENYLEPHRINE HCL 2.5 % OP SOLN
1.0000 [drp] | OPHTHALMIC | Status: AC
  Administered 2014-12-20 (×3): 1 [drp] via OPHTHALMIC

## 2014-12-20 MED ORDER — FENTANYL CITRATE (PF) 100 MCG/2ML IJ SOLN
25.0000 ug | INTRAMUSCULAR | Status: AC
  Administered 2014-12-20 (×2): 25 ug via INTRAVENOUS

## 2014-12-20 MED ORDER — EPINEPHRINE HCL 1 MG/ML IJ SOLN
INTRAMUSCULAR | Status: AC
  Filled 2014-12-20: qty 1

## 2014-12-20 MED ORDER — FENTANYL CITRATE (PF) 100 MCG/2ML IJ SOLN
INTRAMUSCULAR | Status: AC
  Filled 2014-12-20: qty 2

## 2014-12-20 MED ORDER — BSS IO SOLN
INTRAOCULAR | Status: DC | PRN
  Administered 2014-12-20: 15 mL

## 2014-12-20 MED ORDER — MIDAZOLAM HCL 2 MG/2ML IJ SOLN
1.0000 mg | INTRAMUSCULAR | Status: DC | PRN
Start: 2014-12-20 — End: 2014-12-20
  Administered 2014-12-20: 2 mg via INTRAVENOUS

## 2014-12-20 MED ORDER — TETRACAINE HCL 0.5 % OP SOLN
1.0000 [drp] | OPHTHALMIC | Status: AC
  Administered 2014-12-20 (×3): 1 [drp] via OPHTHALMIC

## 2014-12-20 MED ORDER — LACTATED RINGERS IV SOLN
INTRAVENOUS | Status: DC
  Administered 2014-12-20: 09:00:00 via INTRAVENOUS

## 2014-12-20 MED ORDER — NEOMYCIN-POLYMYXIN-DEXAMETH 3.5-10000-0.1 OP SUSP
OPHTHALMIC | Status: DC | PRN
  Administered 2014-12-20: 1 [drp] via OPHTHALMIC

## 2014-12-20 MED ORDER — MIDAZOLAM HCL 2 MG/2ML IJ SOLN
INTRAMUSCULAR | Status: AC
  Filled 2014-12-20: qty 2

## 2014-12-20 MED ORDER — CYCLOPENTOLATE-PHENYLEPHRINE 0.2-1 % OP SOLN
1.0000 [drp] | OPHTHALMIC | Status: AC
  Administered 2014-12-20 (×3): 1 [drp] via OPHTHALMIC

## 2014-12-20 MED ORDER — BSS IO SOLN
INTRAOCULAR | Status: DC | PRN
  Administered 2014-12-20: 500 mL

## 2014-12-20 MED ORDER — POVIDONE-IODINE 5 % OP SOLN
OPHTHALMIC | Status: DC | PRN
  Administered 2014-12-20: 1 via OPHTHALMIC

## 2014-12-20 MED ORDER — LIDOCAINE HCL 3.5 % OP GEL
1.0000 "application " | Freq: Once | OPHTHALMIC | Status: AC
  Administered 2014-12-20: 1 via OPHTHALMIC

## 2014-12-20 MED ORDER — LIDOCAINE HCL (PF) 1 % IJ SOLN
INTRAMUSCULAR | Status: DC | PRN
  Administered 2014-12-20: .4 mL

## 2014-12-20 SURGICAL SUPPLY — 11 items
CLOTH BEACON ORANGE TIMEOUT ST (SAFETY) ×2 IMPLANT
EYE SHIELD UNIVERSAL CLEAR (GAUZE/BANDAGES/DRESSINGS) ×2 IMPLANT
GLOVE BIOGEL PI IND STRL 6.5 (GLOVE) IMPLANT
GLOVE BIOGEL PI INDICATOR 6.5 (GLOVE) ×2
GLOVE EXAM NITRILE LRG STRL (GLOVE) ×3 IMPLANT
PAD ARMBOARD 7.5X6 YLW CONV (MISCELLANEOUS) ×2 IMPLANT
SIGHTPATH CAT PROC W REG LENS (Ophthalmic Related) ×3 IMPLANT
SYRINGE LUER LOK 1CC (MISCELLANEOUS) ×2 IMPLANT
TAPE SURG TRANSPORE 1 IN (GAUZE/BANDAGES/DRESSINGS) IMPLANT
TAPE SURGICAL TRANSPORE 1 IN (GAUZE/BANDAGES/DRESSINGS) ×2
WATER STERILE IRR 250ML POUR (IV SOLUTION) ×2 IMPLANT

## 2014-12-20 NOTE — Anesthesia Preprocedure Evaluation (Signed)
Anesthesia Evaluation  Patient identified by MRN, date of birth, ID band Patient awake    Reviewed: Allergy & Precautions, H&P , NPO status , Patient's Chart, lab work & pertinent test results, reviewed documented beta blocker date and time   History of Anesthesia Complications Negative for: history of anesthetic complications  Airway Mallampati: III  TM Distance: >3 FB Neck ROM: Full    Dental  (+) Edentulous Upper   Pulmonary neg pulmonary ROS,  breath sounds clear to auscultation        Cardiovascular hypertension, Pt. on medications and Pt. on home beta blockers negative cardio ROS  Rhythm:Regular     Neuro/Psych negative neurological ROS  negative psych ROS   GI/Hepatic negative GI ROS, Neg liver ROS, hiatal hernia, GERD-  Medicated,  Endo/Other  negative endocrine ROSdiabetes, Type 2, Oral Hypoglycemic AgentsMorbid obesity  Renal/GU Renal diseasenegative Renal ROS  negative genitourinary   Musculoskeletal negative musculoskeletal ROS (+)   Abdominal   Peds  Hematology negative hematology ROS (+)   Anesthesia Other Findings   Reproductive/Obstetrics negative OB ROS                             Anesthesia Physical Anesthesia Plan  ASA: III  Anesthesia Plan: MAC   Post-op Pain Management:    Induction: Intravenous  Airway Management Planned: Nasal Cannula  Additional Equipment:   Intra-op Plan:   Post-operative Plan:   Informed Consent: I have reviewed the patients History and Physical, chart, labs and discussed the procedure including the risks, benefits and alternatives for the proposed anesthesia with the patient or authorized representative who has indicated his/her understanding and acceptance.     Plan Discussed with:   Anesthesia Plan Comments:         Anesthesia Quick Evaluation

## 2014-12-20 NOTE — H&P (Signed)
I have reviewed the H&P, the patient was re-examined, and I have identified no interval changes in medical condition and plan of care since the history and physical of record  

## 2014-12-20 NOTE — Op Note (Signed)
Date of Admission: 12/20/2014  Date of Surgery: 12/20/2014   Pre-Op Dx: Cataract Right Eye  Post-Op Dx: Senile Combined Cataract Right  Eye,  Dx Code B04.888  Surgeon: Tonny Branch, M.D.  Assistants: None  Anesthesia: Topical with MAC  Indications: Painless, progressive loss of vision with compromise of daily activities.  Surgery: Cataract Extraction with Intraocular lens Implant Right Eye  Discription: The patient had dilating drops and viscous lidocaine placed into the Right eye in the pre-op holding area. After transfer to the operating room, a time out was performed. The patient was then prepped and draped. Beginning with a 32 degree blade a paracentesis port was made at the surgeon's 2 o'clock position. The anterior chamber was then filled with 1% non-preserved lidocaine. This was followed by filling the anterior chamber with Provisc.  A 2.63mm keratome blade was used to make a clear corneal incision at the temporal limbus.  A bent cystatome needle was used to create a continuous tear capsulotomy. Hydrodissection was performed with balanced salt solution on a Fine canula. The lens nucleus was then removed using the phacoemulsification handpiece. Residual cortex was removed with the I&A handpiece. The anterior chamber and capsular bag were refilled with Provisc. A posterior chamber intraocular lens was placed into the capsular bag with it's injector. The implant was positioned with the Kuglan hook. The Provisc was then removed from the anterior chamber and capsular bag with the I&A handpiece. Stromal hydration of the main incision and paracentesis port was performed with BSS on a Fine canula. The wounds were tested for leak which was negative. The patient tolerated the procedure well. There were no operative complications. The patient was then transferred to the recovery room in stable condition.  Complications: None  Specimen: None  EBL: None  Prosthetic device: Hoya iSert 250, power 20.5 D,  SN [id number].

## 2014-12-20 NOTE — Transfer of Care (Signed)
Immediate Anesthesia Transfer of Care Note  Patient: Frank Delacruz  Procedure(s) Performed: Procedure(s) with comments: CATARACT EXTRACTION PHACO AND INTRAOCULAR LENS PLACEMENT RIGHT EYE (Right) - CDE:21.98  Patient Location: Short Stay  Anesthesia Type:MAC  Level of Consciousness: awake, alert , oriented and patient cooperative  Airway & Oxygen Therapy: Patient Spontanous Breathing  Post-op Assessment: Report given to RN, Post -op Vital signs reviewed and stable and Patient moving all extremities  Post vital signs: Reviewed and stable  Last Vitals:  Filed Vitals:   12/20/14 0925  BP: 124/70  Pulse:   Temp:   Resp: 40    Complications: No apparent anesthesia complications

## 2014-12-20 NOTE — Anesthesia Postprocedure Evaluation (Signed)
  Anesthesia Post-op Note  Patient: Frank Delacruz  Procedure(s) Performed: Procedure(s) with comments: CATARACT EXTRACTION PHACO AND INTRAOCULAR LENS PLACEMENT RIGHT EYE (Right) - CDE:21.98  Patient Location: Short Stay  Anesthesia Type:MAC  Level of Consciousness: awake, alert , oriented and patient cooperative  Airway and Oxygen Therapy: Patient Spontanous Breathing  Post-op Pain: none  Post-op Assessment: Post-op Vital signs reviewed, Patient's Cardiovascular Status Stable, Respiratory Function Stable, Patent Airway and Pain level controlled  Post-op Vital Signs: Reviewed and stable  Last Vitals:  Filed Vitals:   12/20/14 0925  BP: 124/70  Pulse:   Temp:   Resp: 40    Complications: No apparent anesthesia complications

## 2014-12-20 NOTE — Discharge Instructions (Signed)

## 2014-12-21 ENCOUNTER — Encounter (HOSPITAL_COMMUNITY): Payer: Self-pay | Admitting: Ophthalmology

## 2015-04-30 ENCOUNTER — Other Ambulatory Visit: Payer: Self-pay | Admitting: Gastroenterology

## 2015-10-12 ENCOUNTER — Other Ambulatory Visit: Payer: Self-pay

## 2015-10-12 MED ORDER — PANTOPRAZOLE SODIUM 40 MG PO TBEC: 40.0000 mg | DELAYED_RELEASE_TABLET | Freq: Every day | ORAL | Status: AC

## 2016-05-31 ENCOUNTER — Ambulatory Visit: Payer: Medicare Other | Admitting: Nurse Practitioner

## 2016-07-13 DEATH — deceased

## 2017-05-16 ENCOUNTER — Encounter: Payer: Self-pay | Admitting: Internal Medicine

## 2017-05-23 ENCOUNTER — Telehealth: Payer: Self-pay | Admitting: Internal Medicine

## 2017-05-23 NOTE — Telephone Encounter (Signed)
Patient wife called and stated patient passed away in Nov 20, 2015

## 2017-05-23 NOTE — Telephone Encounter (Signed)
Communication noted.  

## 2017-05-23 NOTE — Telephone Encounter (Signed)
Noted. Hate to hear that.
